# Patient Record
Sex: Female | Born: 1975 | Race: White | Hispanic: No | Marital: Single | Smoking: Never smoker
Health system: Southern US, Community
[De-identification: ages and names within clinical notes are randomized; demographics above are authoritative.]

## PROBLEM LIST (undated history)

## (undated) ENCOUNTER — Inpatient Hospital Stay (HOSPITAL_COMMUNITY): Payer: Self-pay

## (undated) DIAGNOSIS — B999 Unspecified infectious disease: Secondary | ICD-10-CM

## (undated) DIAGNOSIS — F32A Depression, unspecified: Secondary | ICD-10-CM

## (undated) DIAGNOSIS — D6851 Activated protein C resistance: Secondary | ICD-10-CM

## (undated) DIAGNOSIS — N200 Calculus of kidney: Secondary | ICD-10-CM

## (undated) DIAGNOSIS — R87619 Unspecified abnormal cytological findings in specimens from cervix uteri: Secondary | ICD-10-CM

## (undated) DIAGNOSIS — IMO0002 Reserved for concepts with insufficient information to code with codable children: Secondary | ICD-10-CM

## (undated) DIAGNOSIS — F329 Major depressive disorder, single episode, unspecified: Secondary | ICD-10-CM

## (undated) DIAGNOSIS — E039 Hypothyroidism, unspecified: Secondary | ICD-10-CM

## (undated) HISTORY — PX: NO PAST SURGERIES: SHX2092

## (undated) HISTORY — PX: CERVICAL CERCLAGE: SHX1329

## (undated) HISTORY — PX: CRYOTHERAPY: SHX1416

---

## 2000-09-20 ENCOUNTER — Emergency Department (HOSPITAL_COMMUNITY): Admission: EM | Admit: 2000-09-20 | Discharge: 2000-09-20 | Payer: Self-pay | Admitting: Emergency Medicine

## 2000-10-23 ENCOUNTER — Encounter: Admission: RE | Admit: 2000-10-23 | Discharge: 2000-10-23 | Payer: Self-pay | Admitting: Hematology and Oncology

## 2001-06-11 ENCOUNTER — Emergency Department (HOSPITAL_COMMUNITY): Admission: EM | Admit: 2001-06-11 | Discharge: 2001-06-12 | Payer: Self-pay | Admitting: Emergency Medicine

## 2001-12-31 ENCOUNTER — Inpatient Hospital Stay (HOSPITAL_COMMUNITY): Admission: EM | Admit: 2001-12-31 | Discharge: 2002-01-03 | Payer: Self-pay | Admitting: Psychiatry

## 2002-10-06 ENCOUNTER — Other Ambulatory Visit: Admission: RE | Admit: 2002-10-06 | Discharge: 2002-10-06 | Payer: Self-pay | Admitting: *Deleted

## 2002-10-06 ENCOUNTER — Encounter: Admission: RE | Admit: 2002-10-06 | Discharge: 2002-10-06 | Payer: Self-pay | Admitting: *Deleted

## 2003-01-21 ENCOUNTER — Emergency Department (HOSPITAL_COMMUNITY): Admission: EM | Admit: 2003-01-21 | Discharge: 2003-01-21 | Payer: Self-pay | Admitting: Emergency Medicine

## 2003-01-21 ENCOUNTER — Encounter: Payer: Self-pay | Admitting: Emergency Medicine

## 2003-09-19 ENCOUNTER — Emergency Department (HOSPITAL_COMMUNITY): Admission: EM | Admit: 2003-09-19 | Discharge: 2003-09-19 | Payer: Self-pay | Admitting: Emergency Medicine

## 2003-09-21 ENCOUNTER — Inpatient Hospital Stay (HOSPITAL_COMMUNITY): Admission: AD | Admit: 2003-09-21 | Discharge: 2003-09-21 | Payer: Self-pay | Admitting: *Deleted

## 2003-09-22 ENCOUNTER — Inpatient Hospital Stay (HOSPITAL_COMMUNITY): Admission: AD | Admit: 2003-09-22 | Discharge: 2003-09-22 | Payer: Self-pay | Admitting: Obstetrics and Gynecology

## 2003-09-23 ENCOUNTER — Inpatient Hospital Stay (HOSPITAL_COMMUNITY): Admission: AD | Admit: 2003-09-23 | Discharge: 2003-09-23 | Payer: Self-pay | Admitting: Obstetrics and Gynecology

## 2003-10-19 ENCOUNTER — Encounter: Admission: RE | Admit: 2003-10-19 | Discharge: 2003-10-19 | Payer: Self-pay | Admitting: Obstetrics and Gynecology

## 2003-11-09 ENCOUNTER — Encounter: Admission: RE | Admit: 2003-11-09 | Discharge: 2003-11-09 | Payer: Self-pay | Admitting: Obstetrics and Gynecology

## 2004-02-12 ENCOUNTER — Inpatient Hospital Stay (HOSPITAL_COMMUNITY): Admission: AD | Admit: 2004-02-12 | Discharge: 2004-02-12 | Payer: Self-pay | Admitting: Obstetrics & Gynecology

## 2004-02-17 ENCOUNTER — Inpatient Hospital Stay (HOSPITAL_COMMUNITY): Admission: RE | Admit: 2004-02-17 | Discharge: 2004-02-17 | Payer: Self-pay | Admitting: Obstetrics & Gynecology

## 2004-03-02 ENCOUNTER — Ambulatory Visit (HOSPITAL_COMMUNITY): Admission: RE | Admit: 2004-03-02 | Discharge: 2004-03-02 | Payer: Self-pay | Admitting: *Deleted

## 2004-03-09 ENCOUNTER — Encounter: Admission: RE | Admit: 2004-03-09 | Discharge: 2004-03-09 | Payer: Self-pay | Admitting: Obstetrics and Gynecology

## 2004-03-13 ENCOUNTER — Ambulatory Visit (HOSPITAL_COMMUNITY): Admission: RE | Admit: 2004-03-13 | Discharge: 2004-03-13 | Payer: Self-pay | Admitting: Obstetrics & Gynecology

## 2004-04-11 ENCOUNTER — Inpatient Hospital Stay (HOSPITAL_COMMUNITY): Admission: AD | Admit: 2004-04-11 | Discharge: 2004-04-11 | Payer: Self-pay | Admitting: *Deleted

## 2004-05-19 ENCOUNTER — Ambulatory Visit (HOSPITAL_COMMUNITY): Admission: RE | Admit: 2004-05-19 | Discharge: 2004-05-19 | Payer: Self-pay | Admitting: Obstetrics & Gynecology

## 2004-05-26 ENCOUNTER — Inpatient Hospital Stay (HOSPITAL_COMMUNITY): Admission: AD | Admit: 2004-05-26 | Discharge: 2004-05-26 | Payer: Self-pay | Admitting: Obstetrics

## 2004-07-25 ENCOUNTER — Ambulatory Visit (HOSPITAL_COMMUNITY): Admission: RE | Admit: 2004-07-25 | Discharge: 2004-07-25 | Payer: Self-pay | Admitting: Obstetrics & Gynecology

## 2004-09-12 ENCOUNTER — Inpatient Hospital Stay (HOSPITAL_COMMUNITY): Admission: AD | Admit: 2004-09-12 | Discharge: 2004-09-19 | Payer: Self-pay | Admitting: Obstetrics & Gynecology

## 2004-09-17 ENCOUNTER — Encounter (INDEPENDENT_AMBULATORY_CARE_PROVIDER_SITE_OTHER): Payer: Self-pay | Admitting: *Deleted

## 2004-09-20 ENCOUNTER — Encounter: Admission: RE | Admit: 2004-09-20 | Discharge: 2004-09-20 | Payer: Self-pay | Admitting: Obstetrics & Gynecology

## 2005-03-13 ENCOUNTER — Inpatient Hospital Stay (HOSPITAL_COMMUNITY): Admission: AD | Admit: 2005-03-13 | Discharge: 2005-03-13 | Payer: Self-pay | Admitting: *Deleted

## 2006-04-03 ENCOUNTER — Encounter: Admission: RE | Admit: 2006-04-03 | Discharge: 2006-04-03 | Payer: Self-pay

## 2008-05-07 ENCOUNTER — Emergency Department (HOSPITAL_COMMUNITY): Admission: EM | Admit: 2008-05-07 | Discharge: 2008-05-07 | Payer: Self-pay | Admitting: Family Medicine

## 2008-07-17 ENCOUNTER — Emergency Department (HOSPITAL_COMMUNITY): Admission: EM | Admit: 2008-07-17 | Discharge: 2008-07-17 | Payer: Self-pay | Admitting: Family Medicine

## 2009-01-28 ENCOUNTER — Inpatient Hospital Stay (HOSPITAL_COMMUNITY): Admission: AD | Admit: 2009-01-28 | Discharge: 2009-01-28 | Payer: Self-pay | Admitting: Obstetrics & Gynecology

## 2009-02-05 ENCOUNTER — Inpatient Hospital Stay (HOSPITAL_COMMUNITY): Admission: AD | Admit: 2009-02-05 | Discharge: 2009-02-05 | Payer: Self-pay | Admitting: Obstetrics & Gynecology

## 2009-08-04 ENCOUNTER — Encounter: Admission: RE | Admit: 2009-08-04 | Discharge: 2009-08-04 | Payer: Self-pay | Admitting: Obstetrics and Gynecology

## 2009-10-04 ENCOUNTER — Inpatient Hospital Stay (HOSPITAL_COMMUNITY): Admission: AD | Admit: 2009-10-04 | Discharge: 2009-10-06 | Payer: Self-pay | Admitting: Obstetrics and Gynecology

## 2009-10-12 ENCOUNTER — Ambulatory Visit: Admission: RE | Admit: 2009-10-12 | Discharge: 2009-10-12 | Payer: Self-pay | Admitting: Obstetrics and Gynecology

## 2010-10-15 ENCOUNTER — Encounter: Payer: Self-pay | Admitting: Obstetrics and Gynecology

## 2010-12-10 LAB — CBC
HCT: 36.5 % (ref 36.0–46.0)
HCT: 41.7 % (ref 36.0–46.0)
Hemoglobin: 12.3 g/dL (ref 12.0–15.0)
Hemoglobin: 13.9 g/dL (ref 12.0–15.0)
MCHC: 33.3 g/dL (ref 30.0–36.0)
MCHC: 33.6 g/dL (ref 30.0–36.0)
MCV: 94.2 fL (ref 78.0–100.0)
MCV: 95.3 fL (ref 78.0–100.0)
Platelets: 250 10*3/uL (ref 150–400)
Platelets: 326 10*3/uL (ref 150–400)
RBC: 3.83 MIL/uL — ABNORMAL LOW (ref 3.87–5.11)
RBC: 4.43 MIL/uL (ref 3.87–5.11)
RDW: 13.5 % (ref 11.5–15.5)
RDW: 13.6 % (ref 11.5–15.5)
WBC: 11.8 10*3/uL — ABNORMAL HIGH (ref 4.0–10.5)
WBC: 13.8 10*3/uL — ABNORMAL HIGH (ref 4.0–10.5)

## 2010-12-10 LAB — RPR: RPR Ser Ql: NONREACTIVE

## 2011-01-02 LAB — WET PREP, GENITAL
Clue Cells Wet Prep HPF POC: NONE SEEN
Trich, Wet Prep: NONE SEEN
Yeast Wet Prep HPF POC: NONE SEEN

## 2011-01-02 LAB — CBC
Platelets: 333 10*3/uL (ref 150–400)
RBC: 4.14 MIL/uL (ref 3.87–5.11)
WBC: 8.9 10*3/uL (ref 4.0–10.5)

## 2011-01-02 LAB — HCG, QUANTITATIVE, PREGNANCY
hCG, Beta Chain, Quant, S: 26306 m[IU]/mL — ABNORMAL HIGH (ref <5)
hCG, Beta Chain, Quant, S: 2721 m[IU]/mL — ABNORMAL HIGH (ref <5)

## 2011-01-02 LAB — GC/CHLAMYDIA PROBE AMP, GENITAL
Chlamydia, DNA Probe: NEGATIVE
GC Probe Amp, Genital: NEGATIVE

## 2011-01-02 LAB — ABO/RH: ABO/RH(D): A POS

## 2011-02-09 NOTE — Discharge Summary (Signed)
NAME:  Barbara Tyler, COSTABILE NO.:  [id number]   MEDICAL RECORD NO.:  [id number]          PATIENT TYPE:  INP   LOCATION:  9317                          [facility]ILITY:  [facility]   PHYSICIAN:  Roseanna Rainbow, M.D.DATE OF BIRTH:  1975-12-14   DATE OF ADMISSION:  09/12/2004  DATE OF DISCHARGE:  09/19/2004                                 DISCHARGE SUMMARY   CHIEF COMPLAINT:  The patient is a 35 year old para 0 with an estimated date  of confinement of October 26, 2004, with an intrauterine pregnancy of 33-5/7  weeks, complaining of right-sided flank pain and nausea and vomiting.  Please see the dictated history and physical for further details.   HOSPITAL COURSE:  The patient was admitted.  She was started on parenteral  antibiotics and received IV hydration and narcotic analgesics.  Her symptoms  improved.  An ultrasound demonstrated moderate-to-severe right-sided  hydronephrosis with possible ureteral obstruction.  Urology was consulted  and the patient elected to continue with supportive management.  She then  had spontaneous rupture of membranes.  She subsequently went into preterm  labor and she had a spontaneous vaginal delivery on September 17, 2004.  She  was delivered of a live born female.  Apgars were 9 at one and five minutes  respectively.  The weight was 6 pounds, length was 20 inches.  Post partum  she did well and was discharged to home on post partum day #2.   DISCHARGE DIAGNOSES:  1.  Intrauterine pregnancy at 33+ weeks.  2.  Right-sided urolithiasis.  3.  Preterm premature rupture of membranes with preterm labor and a      subsequent preterm delivery.   CONDITION:  Good.   DIET:  Regular.   ACTIVITY:  Pelvic rest.   MEDICATIONS:  1.  Percocet.  2.  Phenergan.  3.  Prenatal vitamins.   DISPOSITION:  The patient was to follow up in two weeks in the office and  she was also counseled to follow up with urology.      LAJ/MEDQ  D:  10/18/2004  T:   10/18/2004  Job:  [id number]

## 2011-02-09 NOTE — Group Therapy Note (Signed)
NAME:  Barbara Tyler, Barbara Tyler NO.:  [id number]   MEDICAL RECORD NO.:  [id number]                   PATIENT TYPE:  OUT   LOCATION:  [facility]                           [facility]AC[facility]   PHYSICIAN:  Argentina Donovan, MD                     DATE OF BIRTH:  1976-05-03   DATE OF SERVICE:  10/19/2003                                    CLINIC NOTE   REASON FOR VISIT:  The patient is a 35 year old gravida 1 para 0-0-1-0 who  miscarried about 4 weeks ago.  She had had a colposcopy in January 2004  which showed nothing except a slight dysplasia of the cervix, CIN 1, and she  was told to follow up in 6 months.  She was never contacted and finally got  her repeat Pap in November which was abnormal at [facility].  Unfortunately, we do not have the Pap smear result in this young lady who is  a smoker at this point.  We are going to request that it be sent over.  I  think we will do a colposcopy and probably a cryosurgery in one visit, so a  look and treat at the same time unless she has a very high-graded Pap smear.  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   IMPRESSION:  Atypical Pap smear, not available at this time - awaiting  results.                                               Argentina Donovan, MD    PR/MEDQ  D:  10/19/2003  T:  10/19/2003  Job:  [id number]

## 2011-02-09 NOTE — Discharge Summary (Signed)
[facility]  Patient:    ALAJA, GOLDINGER Visit Number: [id number] MRN: [id number]          Service Type: PSY Location: 500 0500 01 Attending Physician:  Jeanice Lim Dictated by:   Jeanice Lim, M.D. Admit Date:  12/31/2001 Discharge Date: 01/03/2002                             Discharge Summary  IDENTIFYING DATA:  This is a 35 year old single Caucasian female voluntarily admitted with suicidal thoughts.  MEDICATIONS:  None.  ALLERGIES:  No known drug allergies.  PHYSICAL EXAMINATION:  Within normal limits.  Neurologically nonfocal.  LABORATORY DATA:  Routine admission labs with CBC and CMET within normal limits.  Urine drug screen positive for marijuana.  Urine pregnancy test negative.  MENTAL STATUS EXAMINATION:  Alert, healthy-appearing female with good hygiene. Pleasant and cooperative.  Good eye contact.  Mood mostly stable and euthymic. Affect bright.  Thought process goal directed.  Thought content predominated by concerns revolving around staying on her medication when not having financial resources.  Cognitively intact.  Judgment and insight fair.  ADMISSION DIAGNOSES: Axis I:    Major depression, recurrent, severe. Axis II:   None. Axis III:  Irritable bowel syndrome by history. Axis IV:   Moderate (problems with primary support system). Axis V:    34/70.  HOSPITAL COURSE:  The patient was admitted with routine p.r.n. medications. Restarted on Lexapro and Ambien p.r.n.  The patient had nausea with Lexapro and was switched to Celexa.  The patient tolerated Celexa without side effects and reported a positive response to clinical intervention.  CONDITION ON DISCHARGE:  Improved mood.  Affect brighter.  Thought process goal directed.  Thought content negative for dangerous ideation or psychotic symptoms.  The patient reported motivation to be compliant with follow-up plan.  DISCHARGE MEDICATIONS: 1. Celexa 20 mg, 1-1/2  q.a.m. 2. Ambien 10 mg q.h.s. p.r.n. insomnia.  FOLLOW-UP:  [facility] on Monday, January 05, 2002 at 11:30 a.m.  DISCHARGE DIAGNOSES: Axis I:    Major depression, recurrent, severe. Axis II:   None. Axis III:  Irritable bowel syndrome by history. Axis IV:   Moderate (problems with primary support system). Axis V:    Global Assessment of Functioning on discharge 55. Dictated by:   Jeanice Lim, M.D. Attending Physician:  Jeanice Lim DD:  02/04/02 TD:  02/06/02 Job: [id number]

## 2011-02-09 NOTE — H&P (Signed)
NAME:  Barbara Tyler, Barbara Tyler NO.:  [id number]   MEDICAL RECORD NO.:  [id number]          PATIENT TYPE:  INP   LOCATION:  9111                          [facility]   PHYSICIAN:  Roseanna Rainbow, M.D.DATE OF BIRTH:  Apr 30, 1976   DATE OF ADMISSION:  09/12/2004  DATE OF DISCHARGE:                                HISTORY & PHYSICAL   CHIEF COMPLAINT:  The patient is a 35 year old para 0 with an estimated date  of confinement of October 26, 2004, with an intrauterine pregnancy at 33-5/7  weeks, complaining of right-sided flank pain and nausea and vomiting.   HISTORY OF PRESENT ILLNESS:  See above.  She denies any fever or urinary  tract infection symptoms.   ANTEPARTUM COURSE:  Prenatal care with [facility]; onset of care at 7 weeks.  Antepartum problems / risks: history of THC use.  The patient was treated  for a urinary tract infection in the first trimester of pregnancy.   PRENATAL SCREENS:  Hemoglobin 13.6, platelets 320,000.  Blood type A-  positive, Rh-antibody negative.  RPR nonreactive.  Rubella immune.  Hepatitis B surface antigen negative.  HIV nonreactive.  Urine culture and  sensitivity:  No growth.   PAST OBSTETRICAL/GYNECOLOGICAL HISTORY:  She has a history of 2 first  trimester spontaneous abortions.  She has a history of cervical dysplasia;  she is status post cryocautery in February of 2005.   PAST MEDICAL HISTORY:  Past medical history remarkable for irritable bowel  syndrome.   PAST SURGICAL HISTORY:  She denies.   FAMILY HISTORY:  Bipolar disorder.   SOCIAL HISTORY:  She has a history of tobacco use, history of THC use.  She  denies any alcohol abuse.   ALLERGIES:  No known drug allergies.   MEDICATIONS:  Prenatal vitamins.   PHYSICAL EXAM:  VITAL SIGNS:  Stable, afebrile.  GENERAL:  The patient appears uncomfortable.  ABDOMEN:  Fetal heart tracing reassuring.  Rare uterine contractions.  Abdomen gravid.  BACK:  Right-sided CVA  tenderness.  PELVIC:  Pelvic exam as per R.N., the cervix is long, closed and posterior.   OTHER LABORATORY AND WORKUP:  White blood cell count 15.7.  Complete  metabolic profile normal.  Urinalysis with moderate hemoglobin, trace  leukocyte esterase, 30 of protein; microscopy with 3-6 rbc's per high-power  field, many bacteria, wbc's 0-2 per high-power field.   A renal ultrasound demonstrate moderate-to-severe right-sided  hydronephrosis, bilateral ureteral jets seen, partial right ureteral  obstruction may be present.   ASSESSMENT:  1.  Intrauterine pregnancy at 33+ weeks.  2.  Right-sided urolithiasis.   PLAN:  Admission, supportive therapy including pain control and IV  hydration, parenteral antibiotics.     Collier Flowers  D:  09/12/2004  T:  09/12/2004  Job:  [id number]

## 2011-02-09 NOTE — H&P (Signed)
[facility]  Patient:    Barbara Tyler, Barbara Tyler Visit Number: [id number] MRN: [id number]          Service Type: PSY Location: 500 0500 01 Attending Physician:  Jeanice Lim Dictated by:   Young Berry Scott, N.P. Admit Date:  12/31/2001                     Psychiatric Admission Assessment  DATE OF ASSESSMENT:  January 01, 2002 at 9:40 a.m.  IDENTIFYING INFORMATION:  This is a 35 year old single Caucasian female who is a voluntary admission.  HISTORY OF PRESENT ILLNESS:  This patient had suicidal thoughts yesterday and attempted to cut her left wrist.  She reports a gradual increase in depression for the past 1-1/2 years after the death of her mother and grandmother along with recent worries about her sister, who is age 44.  The patient endorses progressively increasing anhedonia, sadness with frequent episodes of spontaneous crying, initial insomnia, sleeping only 2-3 hours per night and 1-2 times weekly episodes of anxiety with some chest tightening and shortness of breath.  The patient reports that she did well on Celexa for about six months but then this was stopped by mental health approximately a year ago. Since that time, she has felt her depression increase with her symptoms worse in the past month.  She is able to contract for safety on the unit.  PAST PSYCHIATRIC HISTORY:  The patient is followed at [facility] for the past 1-2 years.  She does have a past history of being treated in residential treatment program for depression at ages 50 and 40.  She denies any prior history of suicide attempts.  SOCIAL HISTORY:  The patient is from [place].  She has been in [place] for the past five years, living in [place] with her fiance.  She attends the community college sporadically.  She has a high school education. No children.  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  She has no history of legal  problems.  FAMILY HISTORY:  Sister with anxiety.  ALCOHOL/DRUG HISTORY:  The patient uses alcohol occasionally, 2-3 times monthly.  She smokes marijuana approximately 3-4 times a month and smokes one pack per day of cigarettes.  MEDICAL HISTORY:  The patient is followed at [facility] for frequent episodes of bacterial vaginosis.  She is not currently on medications.  She does have superficial lacerations through the dermis only to her left wrist and these are crusted and appear to be healing.  The patient also reports a past history of irritable bowel syndrome.  Surgeries are none. Hospitalizations are none.  MEDICATIONS:  None.  The patient uses barrier contraception.  DRUG ALLERGIES:  None.  POSITIVE PHYSICAL FINDINGS:  The patients full physical examination is currently pending.  Her vital signs, on admission to the unit, were temperature 97.7, pulse 90, respirations 20, blood pressure 106/65.  LABORATORY DATA:  Her CBC and CMET are within normal limits.  Her urine drug screen is positive for marijuana.  Her urine pregnancy test was negative.  Her thyroid panel is currently pending.  MENTAL STATUS EXAMINATION:  This is an alert, healthy-appearing female with good hygiene.  She is very pleasant and cooperative with a bright affect, in no acute distress.  She has good eye contact.  She is able to smile brightly though she is appropriately sad when discussing the loss of her mother and grandmother.  Speech is normal in pace and tone  and is spontaneous.  Mood is depressed.  Thought process is logical, goal directed and she is able to track conversations well.  Thought content is predominated by concerns about resolving her symptoms and how best to stay on her medications since she does not have financial resources.  She is appropriately concerned about her sister and is interested in grief counseling for herself.  Cognitively, she is intact and oriented x 3.  Intelligence  is above average.  Judgment and impulse control within normal limits.  Insight is very good.  DIAGNOSES: Axis I:    Major depression, recurrent, severe. Axis II:   None. Axis III:  Irritable bowel syndrome by history. Axis IV:   Moderate (psychosocial problems related to grief over the loss of            her mother and grandmother). Axis V:    Current 36; past year 71.  PLAN:  Voluntarily admit the patient to stabilize her mood and to alleviate her suicidal ideation and strengthen her coping skills.  We have elected to start her on Lexapro 5 mg daily since she does have a history of sensitive stomach and irritable bowel syndrome.  We will start her on a low dose and see how she reacts and increase her dosage appropriately from there.  We will titrate upwards from there.  We have also started her on Ambien 10 mg p.r.n. for insomnia at bedtime and we have discussed the risks and benefits of both these medications and the potential side effects and she is agreeable to starting these.  We will request the casemanager to remember and get her samples for these medications prior to her discharge, so we can make sure that we can assist her in medication compliance and we have discussed other services available at [facility] for ongoing grief counseling.  She will pursue that.  ESTIMATED LENGTH OF STAY:  Three to four days. Dictated by:   Young Berry Scott, N.P. Attending Physician:  Jeanice Lim DD:  01/01/02 TD:  01/01/02 Job: [id number]

## 2011-02-09 NOTE — Group Therapy Note (Signed)
NAME:  Barbara Tyler, Barbara Tyler NO.:  [id number]   MEDICAL RECORD NO.:  [id number]                   PATIENT TYPE:  OUT   LOCATION:  [facility]                           [facility]ACILITY[facility]  [facility]   PHYSICIAN:  Argentina Donovan, MD                     DATE OF BIRTH:  [birth year]   DATE OF SERVICE:  11/09/2003                                    CLINIC NOTE   REASON FOR VISIT:  The patient is a 35 year old gravida 2 para 0-0-2-0 who  had a colposcopy on October 06, 2002 and found to have slight dysplasia.  Pap was repeated at [facility] and she had SIL at the same time.  She has come back for another colposcopy at which time was a successful  colposcopy showing significant area of acetowhite changes between 9 and 1  o'clock.  The endocervix could be easily visualized and the entire 360  degrees of transition zone.  There was no sign of any atypical vessels,  atypia, __________ vessels, or punctations or mosaicism, and my feeling is  this is certainly no worse than the patient had before, so we are going to  immediately treat her with cryosurgery 3 minutes, 5 minute thaw, and a 3  minute freeze.   DIAGNOSIS:  Cervical dysplasia, CIN 1, slight; treated with cryosurgery.  Will repeat the Pap smear in 4 months.                                               Argentina Donovan, MD    PR/MEDQ  D:  11/09/2003  T:  11/09/2003  Job:  [id number]

## 2011-06-22 LAB — POCT RAPID STREP A: Streptococcus, Group A Screen (Direct): POSITIVE — AB

## 2011-06-25 LAB — URINE CULTURE
Colony Count: NO GROWTH
Culture: NO GROWTH

## 2011-06-25 LAB — POCT URINALYSIS DIP (DEVICE)
Nitrite: NEGATIVE
Protein, ur: 30 — AB
pH: 6

## 2011-06-25 LAB — POCT PREGNANCY, URINE: Preg Test, Ur: NEGATIVE

## 2013-02-05 ENCOUNTER — Inpatient Hospital Stay (HOSPITAL_COMMUNITY): Payer: Medicaid Other

## 2013-02-05 ENCOUNTER — Inpatient Hospital Stay (HOSPITAL_COMMUNITY)
Admission: AD | Admit: 2013-02-05 | Discharge: 2013-02-05 | Disposition: A | Discharge status: Home / Self Care | Payer: Medicaid Other | Source: Ambulatory Visit | Attending: Obstetrics and Gynecology | Admitting: Obstetrics and Gynecology

## 2013-02-05 ENCOUNTER — Encounter (HOSPITAL_COMMUNITY): Payer: Self-pay | Admitting: *Deleted

## 2013-02-05 DIAGNOSIS — O368390 Maternal care for abnormalities of the fetal heart rate or rhythm, unspecified trimester, not applicable or unspecified: Secondary | ICD-10-CM | POA: Insufficient documentation

## 2013-02-05 DIAGNOSIS — R109 Unspecified abdominal pain: Secondary | ICD-10-CM | POA: Insufficient documentation

## 2013-02-05 DIAGNOSIS — O209 Hemorrhage in early pregnancy, unspecified: Secondary | ICD-10-CM

## 2013-02-05 DIAGNOSIS — O0990 Supervision of high risk pregnancy, unspecified, unspecified trimester: Secondary | ICD-10-CM

## 2013-02-05 DIAGNOSIS — O26859 Spotting complicating pregnancy, unspecified trimester: Secondary | ICD-10-CM | POA: Insufficient documentation

## 2013-02-05 LAB — URINALYSIS, ROUTINE W REFLEX MICROSCOPIC
Glucose, UA: NEGATIVE mg/dL
Leukocytes, UA: NEGATIVE
Protein, ur: NEGATIVE mg/dL
Specific Gravity, Urine: 1.025 (ref 1.005–1.030)
Urobilinogen, UA: 0.2 mg/dL (ref 0.0–1.0)

## 2013-02-05 LAB — ABO/RH: ABO/RH(D): A POS

## 2013-02-05 LAB — CBC
HCT: 36.6 % (ref 36.0–46.0)
Hemoglobin: 12.5 g/dL (ref 12.0–15.0)
MCHC: 34.2 g/dL (ref 30.0–36.0)
RDW: 12 % (ref 11.5–15.5)
WBC: 7.3 10*3/uL (ref 4.0–10.5)

## 2013-02-05 LAB — POCT PREGNANCY, URINE: Preg Test, Ur: POSITIVE — AB

## 2013-02-05 LAB — HCG, QUANTITATIVE, PREGNANCY: hCG, Beta Chain, Quant, S: 19342 m[IU]/mL — ABNORMAL HIGH (ref <5)

## 2013-02-05 LAB — URINE MICROSCOPIC-ADD ON

## 2013-02-05 NOTE — MAU Note (Signed)
Pt states was out shopping when she had gush of blood. Was not having any pain however is feeling crampy now.

## 2013-02-05 NOTE — MAU Provider Note (Signed)
Chief Complaint  Patient presents with  . Vaginal Bleeding    Subjective Barbara Tyler 37 y.o.  [id number] at [redacted]w[redacted]d by LMP presents with onset today of first episode of a gush of bright red vaginal blood. About 2 wks ago she had a spot of brown blood which she thought was implantation spotting. Has menstrual-like crampy lower abdominal pain which began shortly after the bleeding. Last intercourse this morning a few hours prior to onset of bleeding.  Denies abnormal vaginal discharge or irritation. No dysuria or hematuria. Declines STI testing. Blood type: A pos  Problem list, past medical history, Ob/Gyn history, surgical history, family history and social history reviewed and updated as appropriate. Pertinent Medical History: N/C Pertinent Ob/Gyn History: PTB @32  wks, early SAB x3 (last was Feb 2014) Pertinent Surgical History: none Prescriptions prior to admission  Medication Sig Dispense Refill  . acetaminophen (TYLENOL) 500 MG tablet Take 500 mg by mouth daily as needed for pain.      . Prenatal Vit-Fe Fumarate-FA (PRENATAL MULTIVITAMIN) TABS Take 1 tablet by mouth at bedtime.         Allergies  Allergen Reactions  . Latex Itching and Rash    Burning of skin     Objective   Filed Vitals:   02/05/13 1243  BP: 100/68  Pulse: 77  Temp: 97.5 F (36.4 C)  Resp: 16     Physical Exam General: WN/WD in NAD  Abdom: soft, NT External genitalia: normal; BUS neg  SSE: moderate amount blood; cervix with no lesions, appears closed Bimanual: Cervix closed, long; uterus anteverted, NT, 6-55m weeks size; adnexa nontender, no masses   Lab Results Results for orders placed during the hospital encounter of 02/05/13 (from the past 24 hour(s))  URINALYSIS, ROUTINE W REFLEX MICROSCOPIC     Status: Abnormal   Collection Time    02/05/13 12:38 PM      Result Value Range   Color, Urine YELLOW  YELLOW   APPearance CLEAR  CLEAR   Specific Gravity, Urine 1.025  1.005 - 1.030   pH 5.5   5.0 - 8.0   Glucose, UA NEGATIVE  NEGATIVE mg/dL   Hgb urine dipstick SMALL (*) NEGATIVE   Bilirubin Urine NEGATIVE  NEGATIVE   Ketones, ur NEGATIVE  NEGATIVE mg/dL   Protein, ur NEGATIVE  NEGATIVE mg/dL   Urobilinogen, UA 0.2  0.0 - 1.0 mg/dL   Nitrite NEGATIVE  NEGATIVE   Leukocytes, UA NEGATIVE  NEGATIVE  URINE MICROSCOPIC-ADD ON     Status: None   Collection Time    02/05/13 12:38 PM      Result Value Range   WBC, UA 0-2  <3 WBC/hpf   RBC / HPF 0-2  <3 RBC/hpf   Bacteria, UA RARE  RARE  POCT PREGNANCY, URINE     Status: Abnormal   Collection Time    02/05/13 12:40 PM      Result Value Range   Preg Test, Ur POSITIVE (*) NEGATIVE  CBC     Status: None   Collection Time    02/05/13  1:00 PM      Result Value Range   WBC 7.3  4.0 - 10.5 K/uL   RBC 4.01  3.87 - 5.11 MIL/uL   Hemoglobin 12.5  12.0 - 15.0 g/dL   HCT 40.9  81.1 - 91.4 %   MCV 91.3  78.0 - 100.0 fL   MCH 31.2  26.0 - 34.0 pg   MCHC 34.2  30.0 -  36.0 g/dL   RDW 16.1  09.6 - 04.5 %   Platelets 306  150 - 400 K/uL  ABO/RH     Status: None   Collection Time    02/05/13  1:00 PM      Result Value Range   ABO/RH(D) A POS    HCG, QUANTITATIVE, PREGNANCY     Status: Abnormal   Collection Time    02/05/13  1:00 PM      Result Value Range   hCG, Beta Chain, Quant, S 19342 (*) <5 mIU/mL    Ultrasound    Assessment 1. Bleeding in early pregnancy   2. At high risk for complications of intrauterine pregnancy (IUP)   [redacted]w[redacted]d IUP, embryo with bradycardia Hx PTB and SAB x3  Plan     Discharge home See AVS for pt education    Medication List    TAKE these medications       acetaminophen 500 MG tablet  Commonly known as:  TYLENOL  Take 500 mg by mouth daily as needed for pain.     prenatal multivitamin Tabs  Take 1 tablet by mouth at bedtime.         Follow-up Information   Follow up with [facility] In 2 weeks. (Someone form [facility] will call you with an appontment for F/U  US and prenatal appointment)        Barbara Tyler 02/05/2013 12:50 PM

## 2013-02-06 NOTE — MAU Provider Note (Signed)
Attestation of Attending Supervision of Advanced Practitioner (CNM/NP): Evaluation and management procedures were performed by the Advanced Practitioner under my supervision and collaboration.  I have reviewed the Advanced Practitioner's note and chart, and I agree with the management and plan.  Fredric Slabach 02/06/2013 7:14 AM

## 2013-02-09 ENCOUNTER — Telehealth: Payer: Self-pay | Admitting: *Deleted

## 2013-02-09 NOTE — Telephone Encounter (Signed)
Patient left a message stating that she was seen for bleeding in pregnancy last week 5/15. She was told that we would contact her to have a repeat ultrasound in 1 week. She has not yet heard from [place]. She is scheduled for her first ob visit on 03/03/13.

## 2013-02-10 ENCOUNTER — Telehealth: Payer: Self-pay | Admitting: *Deleted

## 2013-02-10 DIAGNOSIS — O2621 Pregnancy care for patient with recurrent pregnancy loss, first trimester: Secondary | ICD-10-CM

## 2013-02-10 NOTE — Telephone Encounter (Addendum)
Pt left message stating that she is waiting for a call regarding [place] appt.  (pt has new Ob clinic appt on 6/10 and per notes from Deirdre, pt needs repeat trans vag [place] prior to appt)   I scheduled the [place] appt for 5/23 @ 0845. I called pt and left message to call us back and indicate whether or not it is ok to leave appt details on her voice mail.

## 2013-02-13 ENCOUNTER — Encounter (HOSPITAL_COMMUNITY): Payer: Self-pay

## 2013-02-13 ENCOUNTER — Inpatient Hospital Stay (HOSPITAL_COMMUNITY)
Admission: AD | Admit: 2013-02-13 | Discharge: 2013-02-13 | Disposition: A | Discharge status: Home / Self Care | Payer: Medicaid Other | Source: Ambulatory Visit | Attending: Obstetrics and Gynecology | Admitting: Obstetrics and Gynecology

## 2013-02-13 ENCOUNTER — Ambulatory Visit (HOSPITAL_COMMUNITY)
Admission: RE | Admit: 2013-02-13 | Discharge: 2013-02-13 | Disposition: A | Discharge status: Home / Self Care | Payer: Medicaid Other | Source: Ambulatory Visit | Attending: Obstetrics and Gynecology | Admitting: Obstetrics and Gynecology

## 2013-02-13 DIAGNOSIS — Z8751 Personal history of pre-term labor: Secondary | ICD-10-CM | POA: Insufficient documentation

## 2013-02-13 DIAGNOSIS — O2621 Pregnancy care for patient with recurrent pregnancy loss, first trimester: Secondary | ICD-10-CM

## 2013-02-13 DIAGNOSIS — O021 Missed abortion: Secondary | ICD-10-CM | POA: Insufficient documentation

## 2013-02-13 DIAGNOSIS — O039 Complete or unspecified spontaneous abortion without complication: Secondary | ICD-10-CM

## 2013-02-13 DIAGNOSIS — O2620 Pregnancy care for patient with recurrent pregnancy loss, unspecified trimester: Secondary | ICD-10-CM | POA: Insufficient documentation

## 2013-02-13 DIAGNOSIS — O3680X0 Pregnancy with inconclusive fetal viability, not applicable or unspecified: Secondary | ICD-10-CM | POA: Insufficient documentation

## 2013-02-13 DIAGNOSIS — O209 Hemorrhage in early pregnancy, unspecified: Secondary | ICD-10-CM | POA: Insufficient documentation

## 2013-02-13 DIAGNOSIS — R109 Unspecified abdominal pain: Secondary | ICD-10-CM | POA: Insufficient documentation

## 2013-02-13 LAB — CBC
HCT: 37.4 % (ref 36.0–46.0)
MCHC: 34.2 g/dL (ref 30.0–36.0)
MCV: 90.8 fL (ref 78.0–100.0)
Platelets: 297 10*3/uL (ref 150–400)
RDW: 11.9 % (ref 11.5–15.5)
WBC: 7 10*3/uL (ref 4.0–10.5)

## 2013-02-13 MED ORDER — MISOPROSTOL 200 MCG PO TABS: ORAL_TABLET | ORAL | Status: DC

## 2013-02-13 MED ORDER — IBUPROFEN 600 MG PO TABS: 600.0000 mg | ORAL_TABLET | Freq: Four times a day (QID) | ORAL | Status: DC | PRN

## 2013-02-13 MED ORDER — OXYCODONE-ACETAMINOPHEN 5-325 MG PO TABS: 1.0000 | ORAL_TABLET | ORAL | Status: DC | PRN

## 2013-02-13 MED ORDER — PROMETHAZINE HCL 25 MG PO TABS: 25.0000 mg | ORAL_TABLET | Freq: Four times a day (QID) | ORAL | Status: DC | PRN

## 2013-02-13 NOTE — Telephone Encounter (Signed)
Called patient and there was no answer. 

## 2013-02-13 NOTE — MAU Note (Signed)
Patient to MAU for follow up after ultrasound. Patient states she is a little crampy but no bleeding

## 2013-02-13 NOTE — MAU Provider Note (Signed)
[name] is a 37 y.o. [id number] at [redacted]w[redacted]d who presents to MAU today for follow-up US results. Patient states that she has been having cramps that come and go and seem to be worsening. She is having spotting today. States heavier bleeding yesterday. No N/V or fever.   BP 112/73  Pulse 78  Temp(Src) 98.1 F (36.7 C) (Oral)  Resp 16  SpO2 100%  LMP 12/04/2012 GENERAL: Well-developed, well-nourished female in no acute distress.  HEENT: Normocephalic, atraumatic.   LUNGS: Effort normal HEART: Regular rate  SKIN: Warm, dry and without erythema PSYCH: Normal mood and affect   Results for orders placed during the hospital encounter of 02/13/13 (from the past 24 hour(s))  HCG, QUANTITATIVE, PREGNANCY     Status: Abnormal   Collection Time    02/13/13 11:33 AM      Result Value Range   hCG, Beta Chain, Quant, S 13510 (*) <5 mIU/mL  CBC     Status: None   Collection Time    02/13/13 11:34 AM      Result Value Range   WBC 7.0  4.0 - 10.5 K/uL   RBC 4.12  3.87 - 5.11 MIL/uL   Hemoglobin 12.8  12.0 - 15.0 g/dL   HCT 29.5  62.1 - 30.8 %   MCV 90.8  78.0 - 100.0 fL   MCH 31.1  26.0 - 34.0 pg   MCHC 34.2  30.0 - 36.0 g/dL   RDW 65.7  84.6 - 96.2 %   Platelets 297  150 - 400 K/uL    MDM Discussed [place] results with Dr. Erin Fulling. Patient is a candidate for cytotec if she wishes. Follow-up in [facility] clinic.  Quant hCG and CBC drawn Discussed options of expectant management vs cytotec. The patient would like Rx for Cytotec, but is unsure if she will use it yet.   A: IUFD at 6w 4d  P: Discharge home Rx for Cytotec, Phenergan, Ibuprofen and Percocet sent/given to patient's pharmacy Bleeding precautions discussed Patient will follow-up in GYN clinic in ~ 2 weeks Patient may return to MAU as needed or if her condition were to change or worsen  Freddi Starr, PA-C 02/13/2013 11:05 AM

## 2013-02-13 NOTE — Telephone Encounter (Signed)
Attempted to contact patient. No answer. Left message to call clinic back.

## 2013-02-13 NOTE — Telephone Encounter (Signed)
Pt went to [place] appt.

## 2013-02-15 NOTE — MAU Provider Note (Signed)
Attestation of Attending Supervision of Advanced Practitioner (CNM/NP): Evaluation and management procedures were performed by the Advanced Practitioner under my supervision and collaboration.  I have reviewed the Advanced Practitioner's note and chart, and I agree with the management and plan.  Shahzad Thomann 02/15/2013 8:26 AM   

## 2013-02-23 ENCOUNTER — Other Ambulatory Visit: Payer: Medicaid Other

## 2013-02-23 DIAGNOSIS — O039 Complete or unspecified spontaneous abortion without complication: Secondary | ICD-10-CM

## 2013-02-24 LAB — HCG, QUANTITATIVE, PREGNANCY: hCG, Beta Chain, Quant, S: 5040.6 m[IU]/mL

## 2013-02-27 ENCOUNTER — Encounter: Payer: Medicaid Other | Admitting: Obstetrics and Gynecology

## 2013-02-27 ENCOUNTER — Encounter (HOSPITAL_COMMUNITY): Payer: Self-pay | Admitting: *Deleted

## 2013-02-27 ENCOUNTER — Encounter (HOSPITAL_COMMUNITY): Payer: Self-pay | Admitting: Anesthesiology

## 2013-02-27 ENCOUNTER — Inpatient Hospital Stay (HOSPITAL_COMMUNITY): Payer: Medicaid Other | Admitting: Anesthesiology

## 2013-02-27 ENCOUNTER — Encounter (HOSPITAL_COMMUNITY)
Admission: AD | Disposition: A | Discharge status: Home / Self Care | Payer: Self-pay | Source: Ambulatory Visit | Attending: Family Medicine

## 2013-02-27 ENCOUNTER — Inpatient Hospital Stay (HOSPITAL_COMMUNITY): Payer: Medicaid Other

## 2013-02-27 ENCOUNTER — Inpatient Hospital Stay (HOSPITAL_COMMUNITY)
Admission: AD | Admit: 2013-02-27 | Discharge: 2013-02-27 | Disposition: A | Discharge status: Home / Self Care | Payer: Medicaid Other | Source: Ambulatory Visit | Attending: Family Medicine | Admitting: Family Medicine

## 2013-02-27 DIAGNOSIS — O036 Delayed or excessive hemorrhage following complete or unspecified spontaneous abortion: Secondary | ICD-10-CM | POA: Diagnosis present

## 2013-02-27 DIAGNOSIS — O034 Incomplete spontaneous abortion without complication: Secondary | ICD-10-CM | POA: Insufficient documentation

## 2013-02-27 HISTORY — DX: Depression, unspecified: F32.A

## 2013-02-27 HISTORY — DX: Calculus of kidney: N20.0

## 2013-02-27 HISTORY — DX: Major depressive disorder, single episode, unspecified: F32.9

## 2013-02-27 HISTORY — DX: Unspecified infectious disease: B99.9

## 2013-02-27 HISTORY — DX: Reserved for concepts with insufficient information to code with codable children: IMO0002

## 2013-02-27 HISTORY — DX: Unspecified abnormal cytological findings in specimens from cervix uteri: R87.619

## 2013-02-27 HISTORY — PX: DILATION AND EVACUATION: SHX1459

## 2013-02-27 LAB — HCG, QUANTITATIVE, PREGNANCY: hCG, Beta Chain, Quant, S: 2101 m[IU]/mL — ABNORMAL HIGH (ref <5)

## 2013-02-27 LAB — CBC
Platelets: 274 10*3/uL (ref 150–400)
RBC: 3.77 MIL/uL — ABNORMAL LOW (ref 3.87–5.11)
RDW: 12 % (ref 11.5–15.5)
WBC: 6.4 10*3/uL (ref 4.0–10.5)

## 2013-02-27 LAB — TYPE AND SCREEN: Antibody Screen: NEGATIVE

## 2013-02-27 SURGERY — DILATION AND EVACUATION, UTERUS
Anesthesia: General | Site: Uterus | Wound class: Clean Contaminated

## 2013-02-27 MED ORDER — MIDAZOLAM HCL 2 MG/2ML IJ SOLN: 0.5000 mg | Freq: Once | INTRAMUSCULAR | Status: DC | PRN

## 2013-02-27 MED ORDER — PROMETHAZINE HCL 25 MG/ML IJ SOLN: 6.2500 mg | INTRAMUSCULAR | Status: DC | PRN

## 2013-02-27 MED ORDER — LIDOCAINE HCL (CARDIAC) 20 MG/ML IV SOLN
INTRAVENOUS | Status: DC | PRN
  Administered 2013-02-27: 30 mg via INTRAVENOUS

## 2013-02-27 MED ORDER — LACTATED RINGERS IV BOLUS (SEPSIS)
1000.0000 mL | Freq: Once | INTRAVENOUS | Status: AC
  Administered 2013-02-27 (×2): 1000 mL via INTRAVENOUS

## 2013-02-27 MED ORDER — OXYCODONE-ACETAMINOPHEN 5-325 MG PO TABS
1.0000 | ORAL_TABLET | ORAL | Status: DC | PRN
  Administered 2013-02-27: 1 via ORAL

## 2013-02-27 MED ORDER — DEXAMETHASONE SODIUM PHOSPHATE 4 MG/ML IJ SOLN
INTRAMUSCULAR | Status: DC | PRN
  Administered 2013-02-27: 10 mg via INTRAVENOUS

## 2013-02-27 MED ORDER — FAMOTIDINE IN NACL 20-0.9 MG/50ML-% IV SOLN
INTRAVENOUS | Status: AC
  Administered 2013-02-27: 20 mg via INTRAVENOUS
  Filled 2013-02-27: qty 50

## 2013-02-27 MED ORDER — FLUCONAZOLE 150 MG PO TABS: 150.0000 mg | ORAL_TABLET | Freq: Once | ORAL | Status: DC

## 2013-02-27 MED ORDER — SUCCINYLCHOLINE CHLORIDE 20 MG/ML IJ SOLN
INTRAMUSCULAR | Status: DC | PRN
  Administered 2013-02-27: 100 mg via INTRAVENOUS

## 2013-02-27 MED ORDER — KETOROLAC TROMETHAMINE 30 MG/ML IJ SOLN: 15.0000 mg | Freq: Once | INTRAMUSCULAR | Status: DC | PRN

## 2013-02-27 MED ORDER — MEPERIDINE HCL 25 MG/ML IJ SOLN
6.2500 mg | INTRAMUSCULAR | Status: DC | PRN
  Administered 2013-02-27: 12.5 mg via INTRAVENOUS

## 2013-02-27 MED ORDER — CITRIC ACID-SODIUM CITRATE 334-500 MG/5ML PO SOLN: 30.0000 mL | Freq: Once | ORAL | Status: AC

## 2013-02-27 MED ORDER — ONDANSETRON HCL 4 MG/2ML IJ SOLN
INTRAMUSCULAR | Status: DC | PRN
  Administered 2013-02-27: 4 mg via INTRAVENOUS

## 2013-02-27 MED ORDER — BUPIVACAINE-EPINEPHRINE 0.25% -1:200000 IJ SOLN
INTRAMUSCULAR | Status: DC | PRN
  Administered 2013-02-27: 20 mL

## 2013-02-27 MED ORDER — FENTANYL CITRATE 0.05 MG/ML IJ SOLN
INTRAMUSCULAR | Status: DC | PRN
  Administered 2013-02-27: 25 ug via INTRAVENOUS

## 2013-02-27 MED ORDER — FENTANYL CITRATE 0.05 MG/ML IJ SOLN
25.0000 ug | INTRAMUSCULAR | Status: DC | PRN
  Administered 2013-02-27 (×2): 25 ug via INTRAVENOUS

## 2013-02-27 MED ORDER — HYDROMORPHONE HCL PF 1 MG/ML IJ SOLN
1.0000 mg | Freq: Once | INTRAMUSCULAR | Status: AC
  Administered 2013-02-27: 1 mg via INTRAMUSCULAR
  Filled 2013-02-27: qty 1

## 2013-02-27 MED ORDER — FAMOTIDINE IN NACL 20-0.9 MG/50ML-% IV SOLN: 20.0000 mg | Freq: Once | INTRAVENOUS | Status: AC

## 2013-02-27 MED ORDER — LACTATED RINGERS IV SOLN
INTRAVENOUS | Status: DC | PRN
  Administered 2013-02-27: 11:00:00 via INTRAVENOUS

## 2013-02-27 MED ORDER — DOXYCYCLINE HYCLATE 100 MG PO CAPS: 100.0000 mg | ORAL_CAPSULE | Freq: Two times a day (BID) | ORAL | Status: DC

## 2013-02-27 MED ORDER — MIDAZOLAM HCL 5 MG/5ML IJ SOLN
INTRAMUSCULAR | Status: DC | PRN
  Administered 2013-02-27: 2 mg via INTRAVENOUS

## 2013-02-27 MED ORDER — CITRIC ACID-SODIUM CITRATE 334-500 MG/5ML PO SOLN
ORAL | Status: AC
  Administered 2013-02-27: 30 mL via ORAL
  Filled 2013-02-27: qty 15

## 2013-02-27 MED ORDER — PROPOFOL 10 MG/ML IV BOLUS
INTRAVENOUS | Status: DC | PRN
  Administered 2013-02-27: 150 mg via INTRAVENOUS

## 2013-02-27 MED ORDER — FENTANYL CITRATE 0.05 MG/ML IJ SOLN
INTRAMUSCULAR | Status: AC
  Administered 2013-02-27: 25 ug via INTRAVENOUS
  Filled 2013-02-27: qty 2

## 2013-02-27 SURGICAL SUPPLY — 22 items
CATH ROBINSON RED A/P 16FR (CATHETERS) ×2 IMPLANT
CLOTH BEACON ORANGE TIMEOUT ST (SAFETY) ×2 IMPLANT
DECANTER SPIKE VIAL GLASS SM (MISCELLANEOUS) ×2 IMPLANT
GLOVE BIOGEL PI IND STRL 7.0 (GLOVE) ×1 IMPLANT
GLOVE BIOGEL PI INDICATOR 7.0 (GLOVE) ×1
GLOVE ECLIPSE 7.0 STRL STRAW (GLOVE) ×4 IMPLANT
GOWN PREVENTION PLUS XLARGE (GOWN DISPOSABLE) ×2 IMPLANT
GOWN STRL REIN XL XLG (GOWN DISPOSABLE) ×4 IMPLANT
KIT BERKELEY 1ST TRIMESTER 3/8 (MISCELLANEOUS) ×2 IMPLANT
NDL SPNL 22GX3.5 QUINCKE BK (NEEDLE) ×1 IMPLANT
NEEDLE SPNL 22GX3.5 QUINCKE BK (NEEDLE) ×2 IMPLANT
NS IRRIG 1000ML POUR BTL (IV SOLUTION) ×2 IMPLANT
PACK VAGINAL MINOR WOMEN LF (CUSTOM PROCEDURE TRAY) ×2 IMPLANT
PAD OB MATERNITY 4.3X12.25 (PERSONAL CARE ITEMS) ×2 IMPLANT
PAD PREP 24X48 CUFFED NSTRL (MISCELLANEOUS) ×2 IMPLANT
SET BERKELEY SUCTION TUBING (SUCTIONS) ×2 IMPLANT
SYR CONTROL 10ML LL (SYRINGE) ×2 IMPLANT
TOWEL OR 17X24 6PK STRL BLUE (TOWEL DISPOSABLE) ×4 IMPLANT
VACURETTE 10 RIGID CVD (CANNULA) IMPLANT
VACURETTE 7MM CVD STRL WRAP (CANNULA) IMPLANT
VACURETTE 8 RIGID CVD (CANNULA) IMPLANT
VACURETTE 9 RIGID CVD (CANNULA) IMPLANT

## 2013-02-27 NOTE — MAU Note (Signed)
Barbara Tyler anes, at bedside discussing medication crossing into breast milk.  Use of spinal vs general anes.

## 2013-02-27 NOTE — MAU Note (Addendum)
Sudden onset of bleeding upon arrival at clinic.  Had been cramping about before onset, took some motrin at that time.  Had not taken cytotec; had a lot of bleeding on Monday, passed some large clots.

## 2013-02-27 NOTE — Op Note (Signed)
Barbara Tyler  PROCEDURE DATE: 02/27/2013  PREOPERATIVE DIAGNOSIS: 6 week incomplete abortion.  POSTOPERATIVE DIAGNOSIS: The same.  PROCEDURE:    Suction Dilation and Evacuation.  SURGEON:  Aldan Camey S  ANESTHESIA: Brayton Caves, MD GETT  INDICATIONS: 37 y.o. [id number] MAB at [redacted] weeks gestation, needing surgical completion.  Risks of surgery were discussed with the patient including but not limited to: bleeding which may require transfusion; infection which may require antibiotics; injury to uterus or surrounding organs;need for additional procedures including laparotomy or laparoscopy; possibility of intrauterine scarring which may impair future fertility; and other postoperative/anesthesia complications. Written informed consent was obtained.    FINDINGS:  A 8 wk size anteverted/midline/retroverted uterus, moderate amounts of products of conception, specimen sent to pathology.  ANESTHESIA:    Monitored intravenous sedation, paracervical block.  ESTIMATED BLOOD LOSS:  Less than 20 ml.  SPECIMENS:  Products of conception sent to pathology  COMPLICATIONS:  None immediate.  PROCEDURE DETAILS:  The patient received intravenous antibiotics while in the preoperative area.  She was then taken to the operating room where general anesthesia was administered and was found to be adequate.  After an adequate timeout was performed, she was placed in the dorsal lithotomy position and examined; then prepped and draped in the sterile manner.   Her bladder was catheterized for an unmeasured amount of clear, yellow urine. A vaginal speculum was then placed in the patient's vagina and a single tooth tenaculum was applied to the anterior lip of the cervix.  A paracervical block using 1% Lidocaine with Epinephrine was administered. The cervix was gently dilated to accommodate a 9 F mm suction curette that was gently advanced to the uterine fundus.  The suction device was then activated and curette slowly  rotated to clear the uterus of products of conception.  A sharp curettage was then performed to confirm complete emptying of the uterus.There was minimal bleeding noted and the tenaculum removed with good hemostasis noted. The patient tolerated the procedure well.  The patient was taken to the recovery area in stable condition.  Advay Volante S 02/27/2013 11:15 AM

## 2013-02-27 NOTE — MAU Note (Signed)
Pt "felt weird' after dilaudid.  Dizzy.felt like hard to catch breath

## 2013-02-27 NOTE — Anesthesia Preprocedure Evaluation (Signed)
Anesthesia Evaluation  Patient identified by MRN, date of birth, ID band Patient awake    Reviewed: Allergy & Precautions, H&P , Patient's Chart, lab work & pertinent test results, reviewed documented beta blocker date and time   History of Anesthesia Complications Negative for: history of anesthetic complications  Airway Mallampati: II TM Distance: >3 FB Neck ROM: full    Dental no notable dental hx.    Pulmonary neg pulmonary ROS,  breath sounds clear to auscultation  Pulmonary exam normal       Cardiovascular Exercise Tolerance: Good negative cardio ROS  Rhythm:regular Rate:Normal     Neuro/Psych negative neurological ROS  negative psych ROS   GI/Hepatic negative GI ROS, Neg liver ROS,   Endo/Other  negative endocrine ROS  Renal/GU negative Renal ROS     Musculoskeletal   Abdominal   Peds  Hematology negative hematology ROS (+)   Anesthesia Other Findings   Reproductive/Obstetrics negative OB ROS                           Anesthesia Physical Anesthesia Plan  ASA: II and emergent  Anesthesia Plan: General ETT   Post-op Pain Management:    Induction: Rapid sequence, Cricoid pressure planned and Intravenous  Airway Management Planned: Oral ETT  Additional Equipment:   Intra-op Plan:   Post-operative Plan:   Informed Consent: I have reviewed the patients History and Physical, chart, labs and discussed the procedure including the risks, benefits and alternatives for the proposed anesthesia with the patient or authorized representative who has indicated his/her understanding and acceptance.   Dental Advisory Given  Plan Discussed with: CRNA and Surgeon  Anesthesia Plan Comments:         Anesthesia Quick Evaluation

## 2013-02-27 NOTE — MAU Note (Signed)
Pt was down at clinic for f/u- dx with inevitable SAB. Started having heavy bleeding- was brought to MAU via WC

## 2013-02-27 NOTE — MAU Provider Note (Signed)
History     CSN: [id number]  Arrival date and time: 02/27/13 4098   First Provider Initiated Contact with Patient 02/27/13 [id number]      Chief Complaint  Patient presents with  . Vaginal Bleeding   HPI [name] is a 37 y.o. [id number] who was diagnosed with a failed pregnancy at 6w 1d on 02/13/13. The patient states that she did not take the cytotec. She had some bleeding starting last Friday that was lighter and then Monday had heavy bleeding with clots. She had another episode of very heavy bleeding on Wednesday as well. Her bleeding was moderate yesterday and then this morning on her way into the clinic she had a huge gush. She states that she is feeling somewhat weak and shaky. She is also having lower abdominal pain that she rates at 8/10 now.   OB History   Grav Para Term Preterm Abortions TAB SAB Ect Mult Living   6 2 1 1 3  3   2       Past Medical History  Diagnosis Date  . Preterm labor   . Infection     UTI  . Kidney stones   . Abnormal Pap smear     cryo, clean since  . Depression     Past Surgical History  Procedure Laterality Date  . No past surgeries    . Cryotherapy      Family History  Problem Relation Age of Onset  . Cancer Father   . Cancer Maternal Grandmother     breast- great grandma    History  Substance Use Topics  . Smoking status: Never Smoker   . Smokeless tobacco: Never Used  . Alcohol Use: Yes     Comment: rare    Allergies:  Allergies  Allergen Reactions  . Latex Itching and Rash    Burning of skin    Prescriptions prior to admission  Medication Sig Dispense Refill  . ibuprofen (ADVIL,MOTRIN) 600 MG tablet Take 1 tablet (600 mg total) by mouth every 6 (six) hours as needed for pain.  30 tablet  0  . oxyCODONE-acetaminophen (PERCOCET/ROXICET) 5-325 MG per tablet Take 1-2 tablets by mouth every 4 (four) hours as needed for pain.  15 tablet  0  . Prenatal Vit-Fe Fumarate-FA (PRENATAL MULTIVITAMIN) TABS Take 1 tablet by  mouth at bedtime.      . promethazine (PHENERGAN) 25 MG tablet Take 1 tablet (25 mg total) by mouth every 6 (six) hours as needed for nausea.  30 tablet  0    Review of Systems  Constitutional: Negative for malaise/fatigue.  Gastrointestinal: Positive for abdominal pain.  Genitourinary:       + vaginal bleeding  Neurological: Positive for dizziness and weakness. Negative for loss of consciousness.   Physical Exam   Blood pressure 108/62, pulse 62, temperature 98 F (36.7 C), temperature source Oral, resp. rate 18, last menstrual period 12/04/2012, SpO2 100.00%.  Physical Exam  Constitutional: She is oriented to person, place, and time. She appears well-developed and well-nourished.  Patient appears uncomfortable  HENT:  Head: Normocephalic and atraumatic.  Cardiovascular: Tachycardia present.   Respiratory: Effort normal.  Neurological: She is alert and oriented to person, place, and time.  Skin: Skin is warm and dry. No erythema. There is pallor.  Psychiatric: She has a normal mood and affect.   Results for orders placed during the hospital encounter of 02/27/13 (from the past 24 hour(s))  HCG, QUANTITATIVE, PREGNANCY  Status: Abnormal   Collection Time    02/27/13  8:43 AM      Result Value Range   hCG, Beta Chain, Quant, S 2101 (*) <5 mIU/mL  CBC     Status: Abnormal   Collection Time    02/27/13  8:44 AM      Result Value Range   WBC 6.4  4.0 - 10.5 K/uL   RBC 3.77 (*) 3.87 - 5.11 MIL/uL   Hemoglobin 11.6 (*) 12.0 - 15.0 g/dL   HCT 16.1 (*) 09.6 - 04.5 %   MCV 91.5  78.0 - 100.0 fL   MCH 30.8  26.0 - 34.0 pg   MCHC 33.6  30.0 - 36.0 g/dL   RDW 40.9  81.1 - 91.4 %   Platelets 274  150 - 400 K/uL    MAU Course  Procedures None  MDM Discussed patient with Dr. Shawnie Pons.  Dr. Shawnie Pons to MAU to discuss [place] results with the patient. Will go to OR for D&C  Assessment and Plan  A: Incomplete SAB  P: Patient to OR for D&C  Freddi Starr, PA-C 02/27/2013, 10:54 AM

## 2013-02-27 NOTE — Transfer of Care (Signed)
Immediate Anesthesia Transfer of Care Note  Patient: Barbara Tyler  Procedure(s) Performed: Procedure(s): DILATATION AND EVACUATION (N/A)  Patient Location: PACU  Anesthesia Type:General  Level of Consciousness: awake, alert  and patient cooperative  Airway & Oxygen Therapy: Patient Spontanous Breathing  Post-op Assessment: Report given to PACU RN and Post -op Vital signs reviewed and stable  Post vital signs: Reviewed  Complications: No apparent anesthesia complications

## 2013-02-27 NOTE — MAU Note (Signed)
Reassurred, o2 sat, calmer.  States is sensative to meds, percocet knocked her on her but.

## 2013-02-27 NOTE — Anesthesia Procedure Notes (Signed)
Procedure Name: Intubation Performed by: Heena Woodbury G Pre-anesthesia Checklist: Patient identified, Patient being monitored, Timeout performed, Emergency Drugs available and Suction available Patient Re-evaluated:Patient Re-evaluated prior to inductionOxygen Delivery Method: Circle system utilized Preoxygenation: Pre-oxygenation with 100% oxygen Intubation Type: Rapid sequence and Cricoid Pressure applied Laryngoscope Size: 2 Grade View: Grade I Tube type: Oral Tube size: 7.0 mm Number of attempts: 1 Placement Confirmation: ETT inserted through vocal cords under direct vision,  breath sounds checked- equal and bilateral and positive ETCO2 Dental Injury: Teeth and Oropharynx as per pre-operative assessment

## 2013-02-27 NOTE — Anesthesia Postprocedure Evaluation (Signed)
  Anesthesia Post-op Note  Anesthesia Post Note  Patient: Barbara Tyler  Procedure(s) Performed: Procedure(s) (LRB): DILATATION AND EVACUATION (N/A)  Anesthesia type: MAC  Patient location: PACU  Post pain: Pain level controlled  Post assessment: Post-op Vital signs reviewed  Last Vitals:  Filed Vitals:   02/27/13 1200  BP: 102/58  Pulse: 88  Temp: 36.6 C  Resp: 11    Post vital signs: Reviewed  Level of consciousness: sedated  Complications: No apparent anesthesia complications

## 2013-02-27 NOTE — MAU Note (Signed)
meds discussed, pt is breast feeding, asked to feed her child prior to getting pain medication.

## 2013-02-27 NOTE — MAU Provider Note (Signed)
Chart reviewed and agree with management and plan.  

## 2013-02-27 NOTE — MAU Note (Signed)
Bedside U/S.

## 2013-02-27 NOTE — H&P (Signed)
Reva Bores, MD Physician Signed  MAU Provider Note Service date: 02/27/2013 8:42 AM  Chart reviewed and agree with management and plan.     Original Note: Freddi Starr, PA-C    [02/27/2013 8:42 AM]      Freddi Starr, PA-C PHYSICIAN ASSISTANT CERTIFIED Cosign Needed  MAU Provider Note Service date: 02/27/2013 8:42 AM        Chief Complaint   Patient presents with   .  Vaginal Bleeding    HPI [name] is a 37 y.o. [id number] who was diagnosed with a failed pregnancy at 6w 1d on 02/13/13. The patient states that she did not take the cytotec. She had some bleeding starting last Friday that was lighter and then Monday had heavy bleeding with clots. She had another episode of very heavy bleeding on Wednesday as well. Her bleeding was moderate yesterday and then this morning on her way into the clinic she had a huge gush. She states that she is feeling somewhat weak and shaky. She is also having lower abdominal pain that she rates at 8/10 now.     OB History     Grav  Para  Term  Preterm  Abortions  TAB  SAB  Ect  Mult  Living     6  2  1  1  3    3      2       Past Medical History   Diagnosis  Date   .  Preterm labor     .  Infection         UTI   .  Kidney stones     .  Abnormal Pap smear         cryo, clean since   .  Depression        Past Surgical History   Procedure  Laterality  Date   .  No past surgeries       .  Cryotherapy          Family History   Problem  Relation  Age of Onset   .  Cancer  Father     .  Cancer  Maternal Grandmother         breast- great grandma    History   Substance Use Topics   .  Smoking status:  Never Smoker    .  Smokeless tobacco:  Never Used   .  Alcohol Use:  Yes         Comment: rare    Allergies:   Allergen  Reactions   .  Latex  Itching and Rash       Burning of skin    Medication  Sig  Dispense  Refill   .  ibuprofen (ADVIL,MOTRIN) 600 MG tablet  Take 1 tablet (600 mg total) by mouth every 6 (six) hours as needed  for pain.   30 tablet   0   .  oxyCODONE-acetaminophen (PERCOCET/ROXICET) 5-325 MG per tablet  Take 1-2 tablets by mouth every 4 (four) hours as needed for pain.   15 tablet   0   .  Prenatal Vit-Fe Fumarate-FA (PRENATAL MULTIVITAMIN) TABS  Take 1 tablet by mouth at bedtime.         .  promethazine (PHENERGAN) 25 MG tablet  Take 1 tablet (25 mg total) by mouth every 6 (six) hours as needed for nausea.   30 tablet   0    Review of  Systems  Constitutional: Negative for malaise/fatigue.  Gastrointestinal: Positive for abdominal pain.  Genitourinary:        + vaginal bleeding  Neurological: Positive for dizziness and weakness. Negative for loss of consciousness.   Physical Exam    Blood pressure 108/62, pulse 62, temperature 98 F (36.7 C), temperature source Oral, resp. rate 18, last menstrual period 12/04/2012, SpO2 100.00%. Constitutional: She is oriented to person, place, and time. She appears well-developed and well-nourished.  Patient appears uncomfortable  HENT:   Head: Normocephalic and atraumatic.  Cardiovascular: Tachycardia present.   Respiratory: Effort normal.  Neurological: She is alert and oriented to person, place, and time.  Skin: Skin is warm and dry. No erythema. There is pallor.  Psychiatric: She has a normal mood and affect.  Results for orders placed during the hospital encounter of 02/27/13 (from the past 24 hour(s))   HCG, QUANTITATIVE, PREGNANCY     Status: Abnormal     Collection Time      02/27/13  8:43 AM       Result  Value  Range     hCG, Beta Chain, Quant, S  2101 (*)  <5 mIU/mL   CBC     Status: Abnormal     Collection Time      02/27/13  8:44 AM       Result  Value  Range     WBC  6.4   4.0 - 10.5 K/uL     RBC  3.77 (*)  3.87 - 5.11 MIL/uL     Hemoglobin  11.6 (*)  12.0 - 15.0 g/dL     HCT  40.9 (*)  81.1 - 46.0 %     MCV  91.5   78.0 - 100.0 fL     MCH  30.8   26.0 - 34.0 pg     MCHC  33.6   30.0 - 36.0 g/dL     RDW  91.4   78.2 - 15.5 %      Platelets  274   150 - 400 K/uL    U/s shows elongated gestational sac above cervix.  Assesment: Incomplete SAB   Plan: Patient to OR for D&C

## 2013-03-02 ENCOUNTER — Telehealth: Payer: Self-pay | Admitting: *Deleted

## 2013-03-02 NOTE — Telephone Encounter (Signed)
Patient left a message stating that she had a D&C on Friday and took the percocet when she got home it made her very itchy so she didn't take anymore. She wanted to know if there was something else she should take.

## 2013-03-02 NOTE — Telephone Encounter (Signed)
Called Barbara Tyler and left a message we are returning her call- please call clinic again if you still need asistance. .  Per chart review also has ibuprofen 600mg  ordered - need to ask if she is taking that and if it is relieving her pain.

## 2013-03-03 ENCOUNTER — Encounter: Payer: Self-pay | Admitting: Obstetrics & Gynecology

## 2013-03-03 ENCOUNTER — Encounter (HOSPITAL_COMMUNITY): Payer: Self-pay | Admitting: Family Medicine

## 2013-03-03 NOTE — Telephone Encounter (Signed)
Called pt regarding her pain med question. She stated that she has talked with someone from PACU and they called in hydrocodone for her. I told pt that she can also take the ibuprofen for less severe pain or between doses of narcotic if necessary.  Pt voiced understanding.

## 2013-03-23 ENCOUNTER — Ambulatory Visit (INDEPENDENT_AMBULATORY_CARE_PROVIDER_SITE_OTHER): Payer: Medicaid Other | Admitting: Family Medicine

## 2013-03-23 ENCOUNTER — Encounter: Payer: Self-pay | Admitting: Family Medicine

## 2013-03-23 VITALS — BP 101/69 | HR 87 | Temp 97.8°F | Ht 62.0 in | Wt 129.3 lb

## 2013-03-23 DIAGNOSIS — N96 Recurrent pregnancy loss: Secondary | ICD-10-CM

## 2013-03-23 DIAGNOSIS — Z09 Encounter for follow-up examination after completed treatment for conditions other than malignant neoplasm: Secondary | ICD-10-CM

## 2013-03-23 DIAGNOSIS — F329 Major depressive disorder, single episode, unspecified: Secondary | ICD-10-CM

## 2013-03-23 LAB — HEMOGLOBIN A1C
Hgb A1c MFr Bld: 5.1 % (ref <5.7)
Mean Plasma Glucose: 100 mg/dL (ref <117)

## 2013-03-23 MED ORDER — SERTRALINE HCL 50 MG PO TABS: 50.0000 mg | ORAL_TABLET | Freq: Every day | ORAL | Status: DC

## 2013-03-23 NOTE — Assessment & Plan Note (Signed)
Treat with zoloft.

## 2013-03-23 NOTE — Assessment & Plan Note (Addendum)
Will do initial w/u to include APLA, Factor V Leiden, DM and Thyroid.  If has another, consider maternal and paternal chromosomes.

## 2013-03-23 NOTE — Patient Instructions (Addendum)
Recurrent Miscarriage Recurrent miscarriage means that a woman has lost two or more pregnancies in a row. The loss happens before 20 weeks of pregnancy. Primary recurrent miscarriage is with a woman that has never been able to give birth to a child. Secondary recurrent miscarriage is a woman who had a child and then had two or more miscarriages in a row.  CAUSES   Your parents passed it to you (genetic).  Chromosomal defects.  Endocrine disorders. A person's endocrine system includes glands that make hormones.  Having a lack of progesterone hormone in early pregnancy.  Thyroid problems.  Insulin resistance seen in diabetic women and women with Polycystic Ovary Syndrome that is hard to control.  Abnormalities of the uterus.  Certain viral and germ (bacterial) infections.  Autoimmune disorders. This is when the immune system attacks or destroys healthy body tissue.  Smoking, drinking or taking drugs or medicines.  Being exposed to certain chemicals or toxins at home or work. HOME CARE INSTRUCTIONS   See your caregiver if you have two or more miscarriages.  Follow the advice for testing and treatment that your caregiver recommends.  Discuss any concerns or questions with your caregiver.  Do not smoke or drink alcohol when pregnant.  Recurrent miscarriages can have a severe emotional and psychological effect on a couple wanting to have children. They may have feelings of anger, blame, guilt and become depressed after losing a pregnancy many times. Join a support group or see a grief counselor if you have emotional problems because of pregnancy loss. SEEK MEDICAL CARE IF:   You are or think you are pregnant and have any kind of vaginal spotting or bleeding.  You develop low abdominal cramps.  You develop a fever of 102 F (38.9 C) or higher.  You develop abnormal vaginal discharge.  You have been or think you have been exposed to a spreadable (contagious) illness, toxins or  chemicals that make you sick to your stomach (nauseated) or throw up (vomit).  You think you have been exposed to a sexually transmitted disease. Document Released: 02/27/2008 Document Revised: 12/03/2011 Document Reviewed: 02/27/2008 [facility] Patient Information 2014 [place], [place]. Depression, Adult Depression refers to feeling sad, low, down in the dumps, blue, gloomy, or empty. In general, there are two kinds of depression: 1. Depression that we all experience from time to time because of upsetting life experiences, including the loss of a job or the ending of a relationship (normal sadness or normal grief). This kind of depression is considered normal, is short lived, and resolves within a few days to 2 weeks. (Depression experienced after the loss of a loved one is called bereavement. Bereavement often lasts longer than 2 weeks but normally gets better with time.) 2. Clinical depression, which lasts longer than normal sadness or normal grief or interferes with your ability to function at home, at work, and in school. It also interferes with your personal relationships. It affects almost every aspect of your life. Clinical depression is an illness. Symptoms of depression also can be caused by conditions other than normal sadness and grief or clinical depression. Examples of these conditions are listed as follows:  Physical illness Some physical illnesses, including underactive thyroid gland (hypothyroidism), severe anemia, specific types of cancer, diabetes, uncontrolled seizures, heart and lung problems, strokes, and chronic pain are commonly associated with symptoms of depression.  Side effects of some prescription medicine In some people, certain types of prescription medicine can cause symptoms of depression.  Substance abuse Abuse  of alcohol and illicit drugs can cause symptoms of depression. SYMPTOMS Symptoms of normal sadness and normal grief include the following:  Feeling sad or  crying for short periods of time.  Not caring about anything (apathy).  Difficulty sleeping or sleeping too much.  No longer able to enjoy the things you used to enjoy.  Desire to be by oneself all the time (social isolation).  Lack of energy or motivation.  Difficulty concentrating or remembering.  Change in appetite or weight.  Restlessness or agitation. Symptoms of clinical depression include the same symptoms of normal sadness or normal grief and also the following symptoms:  Feeling sad or crying all the time.  Feelings of guilt or worthlessness.  Feelings of hopelessness or helplessness.  Thoughts of suicide or the desire to harm yourself (suicidal ideation).  Loss of touch with reality (psychotic symptoms). Seeing or hearing things that are not real (hallucinations) or having false beliefs about your life or the people around you (delusions and paranoia). DIAGNOSIS  The diagnosis of clinical depression usually is based on the severity and duration of the symptoms. Your caregiver also will ask you questions about your medical history and substance use to find out if physical illness, use of prescription medicine, or substance abuse is causing your depression. Your caregiver also may order blood tests. TREATMENT  Typically, normal sadness and normal grief do not require treatment. However, sometimes antidepressant medicine is prescribed for bereavement to ease the depressive symptoms until they resolve. The treatment for clinical depression depends on the severity of your symptoms but typically includes antidepressant medicine, counseling with a mental health professional, or a combination of both. Your caregiver will help to determine what treatment is best for you. Depression caused by physical illness usually goes away with appropriate medical treatment of the illness. If prescription medicine is causing depression, talk with your caregiver about stopping the medicine,  decreasing the dose, or substituting another medicine. Depression caused by abuse of alcohol or illicit drugs abuse goes away with abstinence from these substances. Some adults need professional help in order to stop drinking or using drugs. SEEK IMMEDIATE CARE IF:  You have thoughts about hurting yourself or others.  You lose touch with reality (have psychotic symptoms).  You are taking medicine for depression and have a serious side effect. FOR MORE INFORMATION National Alliance on Mental Illness: www.nami.[employer]: http://www.maynard.net/ Document Released: 09/07/2000 Document Revised: 03/11/2012 Document Reviewed: 12/10/2011 [facility] Patient Information 2014 [place], [place].

## 2013-03-23 NOTE — Progress Notes (Signed)
  Subjective:    Patient ID: Barbara Tyler, female    DOB: 1976/03/11, 37 y.o.   MRN: [id number]  HPI  Here for f/u.  Had incomplete AB, and D and C.  This makes 4th first trimester Ab.  Feels very depressed.  Early am rising.  Wants medication.  Previously on Zoloft after her mother's death.  Review of Systems  Constitutional: Negative for fever and chills.  Genitourinary: Negative for vaginal bleeding.  Musculoskeletal: Negative for back pain.  Skin: Negative for rash.       Objective:   Physical Exam  Vitals reviewed. Constitutional: She appears well-developed and well-nourished. No distress.  HENT:  Head: Normocephalic and atraumatic.  Eyes: No scleral icterus.  Neck: Neck supple.  Cardiovascular: Normal rate.   No murmur heard. Pulmonary/Chest: Effort normal.  Abdominal: Soft.          Assessment & Plan:

## 2013-03-24 LAB — LUPUS ANTICOAGULANT PANEL: PTT Lupus Anticoagulant: 27.4 secs — ABNORMAL LOW (ref 28.0–43.0)

## 2013-03-24 LAB — FACTOR 5 LEIDEN

## 2013-03-24 LAB — CARDIOLIPIN ANTIBODIES, IGM+IGG
Anticardiolipin IgG: 8 GPL U/mL (ref <23)
Anticardiolipin IgM: 3 MPL U/mL (ref <11)

## 2013-03-31 ENCOUNTER — Telehealth: Payer: Self-pay | Admitting: *Deleted

## 2013-03-31 NOTE — Telephone Encounter (Signed)
Called Barbara Tyler and informed her that Dr. Shawnie Pons would like her to come in to discuss test results. We have appt tomorrow @ 1315 available. Barbara Tyler voiced understanding and agreed to appt.

## 2013-04-01 ENCOUNTER — Ambulatory Visit (INDEPENDENT_AMBULATORY_CARE_PROVIDER_SITE_OTHER): Payer: Medicaid Other | Admitting: Family Medicine

## 2013-04-01 VITALS — BP 100/70 | HR 90 | Temp 98.1°F | Ht 64.0 in | Wt 126.0 lb

## 2013-04-01 DIAGNOSIS — D6859 Other primary thrombophilia: Secondary | ICD-10-CM

## 2013-04-01 DIAGNOSIS — N96 Recurrent pregnancy loss: Secondary | ICD-10-CM

## 2013-04-01 DIAGNOSIS — F329 Major depressive disorder, single episode, unspecified: Secondary | ICD-10-CM

## 2013-04-01 DIAGNOSIS — E038 Other specified hypothyroidism: Secondary | ICD-10-CM

## 2013-04-01 DIAGNOSIS — F32A Depression, unspecified: Secondary | ICD-10-CM

## 2013-04-01 DIAGNOSIS — D6851 Activated protein C resistance: Secondary | ICD-10-CM | POA: Insufficient documentation

## 2013-04-01 DIAGNOSIS — E039 Hypothyroidism, unspecified: Secondary | ICD-10-CM

## 2013-04-01 NOTE — Progress Notes (Signed)
  Subjective:    Patient ID: MERSEDES ALBER, female    DOB: 1976/08/25, 37 y.o.   MRN: [id number]  HPI  Here today to f/u results.  Has had 3 SAB's and w/u for coagulopathy and DM, TSH and Factor V Leiden, revealed no DM, mildly elevated TSH and heterozygous for Factor V Leiden.  No h/o clot but did have abruption with one pregnancy.  Review of Systems  Constitutional: Negative for fever and chills.  Genitourinary:       LMP 7/3-light       Objective:   Physical Exam  Vitals reviewed. Constitutional: She appears well-developed and well-nourished.  Cardiovascular: Normal rate.   Pulmonary/Chest: Effort normal.  Abdominal: Soft.          Assessment & Plan:  See problem list

## 2013-04-01 NOTE — Patient Instructions (Addendum)
Hypothyroidism The thyroid is a large gland located in the lower front of your neck. The thyroid gland helps control metabolism. Metabolism is how your body handles food. It controls metabolism with the hormone thyroxine. When this gland is underactive (hypothyroid), it produces too little hormone.  CAUSES These include:   Absence or destruction of thyroid tissue.  Goiter due to iodine deficiency.  Goiter due to medications.  Congenital defects (since birth).  Problems with the pituitary. This causes a lack of TSH (thyroid stimulating hormone). This hormone tells the thyroid to turn out more hormone. SYMPTOMS  Lethargy (feeling as though you have no energy)  Cold intolerance  Weight gain (in spite of normal food intake)  Dry skin  Coarse hair  Menstrual irregularity (if severe, may lead to infertility)  Slowing of thought processes Cardiac problems are also caused by insufficient amounts of thyroid hormone. Hypothyroidism in the newborn is cretinism, and is an extreme form. It is important that this form be treated adequately and immediately or it will lead rapidly to retarded physical and mental development. DIAGNOSIS  To prove hypothyroidism, your caregiver may do blood tests and ultrasound tests. Sometimes the signs are hidden. It may be necessary for your caregiver to watch this illness with blood tests either before or after diagnosis and treatment. TREATMENT  Low levels of thyroid hormone are increased by using synthetic thyroid hormone. This is a safe, effective treatment. It usually takes about four weeks to gain the full effects of the medication. After you have the full effect of the medication, it will generally take another four weeks for problems to leave. Your caregiver may start you on low doses. If you have had heart problems the dose may be gradually increased. It is generally not an emergency to get rapidly to normal. HOME CARE INSTRUCTIONS   Take your  medications as your caregiver suggests. Let your caregiver know of any medications you are taking or start taking. Your caregiver will help you with dosage schedules.  As your condition improves, your dosage needs may increase. It will be necessary to have continuing blood tests as suggested by your caregiver.  Report all suspected medication side effects to your caregiver. SEEK MEDICAL CARE IF: Seek medical care if you develop:  Sweating.  Tremulousness (tremors).  Anxiety.  Rapid weight loss.  Heat intolerance.  Emotional swings.  Diarrhea.  Weakness. SEEK IMMEDIATE MEDICAL CARE IF:  You develop chest pain, an irregular heart beat (palpitations), or a rapid heart beat. MAKE SURE YOU:   Understand these instructions.  Will watch your condition.  Will get help right away if you are not doing well or get worse. Document Released: 09/10/2005 Document Revised: 12/03/2011 Document Reviewed: 04/30/2008 [facility] Patient Information 2014 [place], [place].  Official reprint from UpToDate  www.uptodate.com    Patient information: Factor V Leiden (The Basics)Written by the doctors and Visual merchandiser at UpToDate  What is factor V Leiden? - Factor V Leiden is a condition that makes blood more likely to form clots in the legs, lungs, and other parts of the body.  Blood clots can be dangerous. If a clot forms inside a blood vessel, it can clog the vessel and keep blood from getting where it needs to go. If a clot travels to the lungs, it can cause breathing problems or even death. Factor V Leiden is a life-long condition that people are born with. It is caused by an abnormal gene. Parents pass the abnormal gene to their child. Often, parents might  not know they have the abnormal gene because they don't have any symptoms of the condition.  What are the symptoms of factor V Leiden? - Factor V Leiden does not cause symptoms unless a blood clot happens. Symptoms of a clot can include: Warmth,  redness, pain, and swelling in the leg - These are symptoms of a condition called "deep vein thrombosis" or "DVT." This is a blood clot in a vein deep inside the leg (figure 1).  Breathing problems, sharp chest pain, coughing, and fast heartbeat - These are symptoms of a condition called "pulmonary embolism." This is a blood clot in the lungs.  Headache, trouble moving or talking, and seizures - These can be symptoms of a type of stroke caused by factor V Leiden. Should I see a doctor or nurse? - See your doctor or nurse right away if you think you might have a blood clot in your leg. If you have symptoms of a stroke or a blood clot in the lungs, call [id number]. These conditions are very serious and can be life-threatening.  Is there a test for factor V Leiden? - Yes. If your doctor or nurse thinks you might have factor V Leiden, he or she can order a blood test to look for the abnormal gene.  How is factor V Leiden treated? - If factor V Leiden causes a blood clot, the main treatment is medicines that get rid of clots or keep them from getting bigger. Some of these medicines come in shots and others come in pills. Doctors sometimes do surgery to remove a blood clot in the lungs or help with brain problems from a stroke. People who have had a blood clot usually take a medicine called warfarin (brand name: Coumadin) by mouth for at least 3 months after the clot is found. This medicine helps keep new blood clots from forming. It is important because people who have 1 clot often have another later on.  Is there anything I can do to prevent blood clots? - Yes. People sometimes get clots because they have been sitting still for too long. Taking a long trip raises your risk of blood clots, especially if you fly. To help prevent a clot on a long plane trip, you can: Stand up and walk around every hour or 2  Not smoke just before a plane trip  Wear loose, comfortable clothes  Change your sitting position and move  your legs and feet often  Drink plenty of fluids  Wear knee-high compression stockings  Avoid alcohol and medicines that make you sleepy - These can keep you from moving around enough.  Women with factor V Leiden should ask a doctor or nurse if it is safe to take birth control pills. The hormones in these pills raise the risk of blood clots. This can be dangerous for women with factor V Leiden. What if I want to get pregnant or need surgery? - If you want to get pregnant or need surgery, tell your doctor or nurse if: You have had a blood clot  Your father, mother, brother, or sister had a blood clot before age 60 If the doctor or nurse thinks you might have factor V Leiden, he or she can order a blood test to look for it. If you have it, you might need to take medicine to help prevent blood clots and other problems.  More on this topic Patient information: Deep vein thrombosis (blood clots in the legs) (The Basics) Patient information: Pulmonary  embolism (blood clot in the lungs) (The Basics) Patient information: Stroke (The Basics) Patient information: Deep vein thrombosis (DVT) (Beyond the Basics) Patient information: Stroke symptoms and diagnosis (Beyond the Basics) Patient information: Warfarin (Coumadin) (Beyond the Basics) All topics are updated as new evidence becomes available and our peer review process is complete.  This topic retrieved from UpToDate on: Apr 01, 2013.

## 2013-04-01 NOTE — Assessment & Plan Note (Signed)
?   Related to thyroid.  Full TFT's today, if abnl refer to PCP.  Discussed implication of Factor V Leiden and possible anti-coagulation during pregnancy.  Not clearly indicated, but would be ok, if she desires.  Recommend baby ASA and possible MFM referral with next pregnancy.

## 2013-04-02 ENCOUNTER — Telehealth: Payer: Self-pay | Admitting: *Deleted

## 2013-04-02 LAB — T4, FREE: Free T4: 1.24 ng/dL (ref 0.80–1.80)

## 2013-04-02 LAB — TSH: TSH: 3.519 u[IU]/mL (ref 0.350–4.500)

## 2013-04-02 LAB — T3, FREE: T3, Free: 2.4 pg/mL (ref 2.3–4.2)

## 2013-04-02 NOTE — Assessment & Plan Note (Signed)
Unclear if this is truly subclinical or not--will repeat TSH and add Free T3 and Free T4. 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

## 2013-04-02 NOTE — Telephone Encounter (Addendum)
Message copied by Jill Side on Thu Apr 02, 2013  4:15 PM ------      Message from: Reva Bores      Created: Thu Apr 02, 2013  2:59 PM       Please call pt. And let her know that all labs on repeat were normal.  Her thyroid should probably be rechecked in 4-6 months to ensure.  May be resetting after pregnancy.  May be subclinical--i.e. We cannot measure yet. Her Free T3 is at low end of normal.  ------ Called pt to inform her of test results- she was not at home. I spoke with a female and informed him that I will call back tomorrow.       7/15  1500  Called pt and spoke w/a female who stated that she is not home.  I stated that I will try again later or tomorrow morning.       7/16   0925 Called and spoke w/pt. I informed her of thyroid function results and the recommendation from Dr. Shawnie Pons that she have them checked again in 4-6 mos.  Pt voiced understanding.

## 2013-04-02 NOTE — Assessment & Plan Note (Signed)
No h/o clot--just multiple SAB's.  Discussed current uptodate and CHEST guidelines, do not advise anti-coagulation with this with no h/o untoward 3rd trim. Event or clot.  Per pt. Had abruptio--which might qualify.  Advised baby ASA, early prenatal care and possible MFM referral.

## 2013-04-02 NOTE — Assessment & Plan Note (Signed)
Advised safety of Zoloft in breastfeeding.

## 2013-09-24 NOTE — L&D Delivery Note (Signed)
Patient is 38 y.o. [id number] admitted in active labor, hx of cerclage, anxiety  Delivery Note At 7:32 AM a viable female was delivered via Vaginal, Spontaneous Delivery (Presentation: ; Occiput Anterior).  APGAR: 8, 9; weight 7 lb 4.4 oz (3300 g).   Placenta status: Intact, Spontaneous.  Cord: 3 vessels with the following complications: true Knot.   Anesthesia: None  Episiotomy: None Lacerations: 1st degree;Perineal Suture Repair: n/a Est. Blood Loss (mL): 250  Mom to postpartum.  Baby to Couplet care / Skin to Skin.  No peripheral IV placed secondary to eminent delivery, declined pitocin IM and patient counseled accordingly of risk of postpartum hemorrhage.  [name] 08/12/2014, 10:36 AM

## 2013-09-29 ENCOUNTER — Telehealth: Payer: Self-pay | Admitting: General Practice

## 2013-09-29 NOTE — Telephone Encounter (Signed)
Called patient stating I was returning her phone call and would like to get an appt for her to speak with a  Doctor about her getting pregnant because they would likely want her to start medication. Patient verbalized understanding. Told patient I would send a message to the front office staff to call her with an appt for probably early February. Patient was satisfied, verbalized understanding and had no further questions

## 2013-09-29 NOTE — Telephone Encounter (Signed)
Patient called and left message stating that she was seen here in June for a miscarriage that led to a d/c and diagnosis of clotting disorders and other things and that now her and her husband are wanting to get pregnant but she knows she is high risk and just has some questions about how everything would work and what she would need to do and would like a call back soon.

## 2013-10-06 ENCOUNTER — Encounter: Payer: Self-pay | Admitting: Obstetrics & Gynecology

## 2013-10-30 ENCOUNTER — Ambulatory Visit (INDEPENDENT_AMBULATORY_CARE_PROVIDER_SITE_OTHER): Payer: Medicaid Other | Admitting: Obstetrics & Gynecology

## 2013-10-30 ENCOUNTER — Encounter: Payer: Self-pay | Admitting: Obstetrics & Gynecology

## 2013-10-30 VITALS — BP 100/77 | HR 97 | Ht 64.0 in | Wt 123.0 lb

## 2013-10-30 DIAGNOSIS — N96 Recurrent pregnancy loss: Secondary | ICD-10-CM

## 2013-10-30 NOTE — Progress Notes (Signed)
Patient ID: Barbara McmurrayBarbara L Guagliardo, female   DOB: 1975-10-31, 38 y.o.   MRN: [id number]  Chief Complaint  Patient presents with  . Advice Only    Wants to discuss getting pregnant    HPI Barbara McmurrayBarbara L Lagunes is a 38 y.o. female.  [id number] Patient's last menstrual period was 10/25/2013. Plans to conceive, wants to be sure to establish early prenatal care due to h/o recurrent Sab.  HPI  Past Medical History  Diagnosis Date  . Preterm labor   . Infection     UTI  . Kidney stones   . Abnormal Pap smear     cryo, clean since  . Depression     Past Surgical History  Procedure Laterality Date  . No past surgeries    . Cryotherapy    . Dilation and evacuation N/A 02/27/2013    Procedure: DILATATION AND EVACUATION;  Surgeon: Reva Boresanya S Pratt, MD;  Location: [facility];  Service: Gynecology;  Laterality: N/A;    Family History  Problem Relation Age of Onset  . Cancer Father   . Cancer Maternal Grandmother     breast- great grandma    Social History History  Substance Use Topics  . Smoking status: Never Smoker   . Smokeless tobacco: Never Used  . Alcohol Use: Yes     Comment: rare    Allergies  Allergen Reactions  . Latex Itching and Rash    Burning of skin    Current Outpatient Prescriptions  Medication Sig Dispense Refill  . atomoxetine (STRATTERA) 40 MG capsule Take 40 mg by mouth daily.      . Multiple Vitamin (MULTIVITAMIN) tablet Take 1 tablet by mouth daily.      . TURMERIC PO Take 2 capsules by mouth daily.       No current facility-administered medications for this visit.    Review of Systems Review of Systems  Constitutional: Negative.   Gastrointestinal: Negative.   Genitourinary: Negative for vaginal discharge and menstrual problem.    Blood pressure 100/77, pulse 97, height 5\' 4"  (1.626 m), weight 123 lb (55.792 kg), last menstrual period 10/25/2013.  Physical Exam Physical Exam  Constitutional: She is oriented to person, place, and time. She appears  well-developed and well-nourished. No distress.  Neurological: She is alert and oriented to person, place, and time.  Psychiatric: She has a normal mood and affect. Her behavior is normal.    Data Reviewed offic e notes  Assessment    No h/o clot--just multiple SAB's. Discussed current uptodate and CHEST guidelines, do not advise anti-coagulation with this with no h/o untoward 3rd trim. Event or clot. Per pt. Had abruptio--which might qualify. Advised baby ASA, early prenatal care and possible MFM referral. Per Dr. Shawnie PonsPratt     Plan    Call if positive pregnancy test for early visit and possible MFM        ARNOLD,JAMES 10/30/2013, 11:15 AM

## 2013-10-30 NOTE — Patient Instructions (Signed)
Recurrent Miscarriage  Recurrent miscarriage means that a woman has lost two or more pregnancies in a row. The loss happens before 20 weeks of pregnancy. Primary recurrent miscarriage is with a woman that has never been able to give birth to a child. Secondary recurrent miscarriage is a woman who had a child and then had two or more miscarriages in a row.   CAUSES   · Your parents passed it to you (genetic).  · Chromosomal defects.  · Endocrine disorders. A person's endocrine system includes glands that make hormones.  · Having a lack of progesterone hormone in early pregnancy.  · Thyroid problems.  · Insulin resistance seen in diabetic women and women with Polycystic Ovary Syndrome that is hard to control.  · Abnormalities of the uterus.  · Certain viral and germ (bacterial) infections.  · Autoimmune disorders. This is when the immune system attacks or destroys healthy body tissue.  · Smoking, drinking or taking drugs or medicines.  · Being exposed to certain chemicals or toxins at home or work.  HOME CARE INSTRUCTIONS   · See your caregiver if you have two or more miscarriages.  · Follow the advice for testing and treatment that your caregiver recommends.  · Discuss any concerns or questions with your caregiver.  · Do not smoke or drink alcohol when pregnant.  · Recurrent miscarriages can have a severe emotional and psychological effect on a couple wanting to have children. They may have feelings of anger, blame, guilt and become depressed after losing a pregnancy many times. Join a support group or see a grief counselor if you have emotional problems because of pregnancy loss.  SEEK MEDICAL CARE IF:   · You are or think you are pregnant and have any kind of vaginal spotting or bleeding.  · You develop low abdominal cramps.  · You develop a fever of 102° F (38.9° C) or higher.  · You develop abnormal vaginal discharge.  · You have been or think you have been exposed to a spreadable (contagious) illness, toxins or  chemicals that make you sick to your stomach (nauseated) or throw up (vomit).  · You think you have been exposed to a sexually transmitted disease.  Document Released: 02/27/2008 Document Revised: 12/03/2011 Document Reviewed: 02/27/2008  ExitCare® Patient Information ©2014 [employer].

## 2013-10-30 NOTE — Progress Notes (Signed)
Patient wants to discuss getting pregnant. She was told that she may need to be put on medication prior to getting pregnant this time, to help keep the pregnancy.

## 2013-12-14 ENCOUNTER — Ambulatory Visit (INDEPENDENT_AMBULATORY_CARE_PROVIDER_SITE_OTHER): Payer: Medicaid Other | Admitting: Obstetrics & Gynecology

## 2013-12-14 DIAGNOSIS — Z3200 Encounter for pregnancy test, result unknown: Secondary | ICD-10-CM

## 2013-12-14 DIAGNOSIS — N96 Recurrent pregnancy loss: Secondary | ICD-10-CM

## 2013-12-14 LAB — POCT PREGNANCY, URINE: PREG TEST UR: NEGATIVE

## 2013-12-14 NOTE — Progress Notes (Signed)
Positvie preg test at home but negative UPT here today. Ordered serum HCG, will notify of result  Adam PhenixJames G Arayah Krouse, MD 12/14/2013

## 2013-12-15 ENCOUNTER — Telehealth: Payer: Self-pay | Admitting: *Deleted

## 2013-12-15 DIAGNOSIS — Z8489 Family history of other specified conditions: Secondary | ICD-10-CM

## 2013-12-15 LAB — HCG, QUANTITATIVE, PREGNANCY: hCG, Beta Chain, Quant, S: 11.3 m[IU]/mL

## 2013-12-15 MED ORDER — PROGESTERONE 200 MG VA SUPP: 200.0000 mg | Freq: Every day | VAGINAL | Status: DC

## 2013-12-15 NOTE — Telephone Encounter (Signed)
Patient called nurse line requesting lab results that were drawn yesterday.  Contacted patient, gave results, patient states she was told to get progesterone vaginal supp as soon as a positive pregnancy test.  States Dr. Debroah LoopArnold instructed her to follow this protocol.  Informed patient that I will consult with a provider concerning her treatment plan.  Pt verbalizes understanding.

## 2013-12-15 NOTE — Telephone Encounter (Signed)
Consulted with Dr. Shawnie PonsPratt, new order given, called patient, and informed of medication being prescribed.  Pt will call for prenatal appt and lab/urine pregnancy test in a couple of weeks.  Pt verbalizes understanding.

## 2013-12-16 ENCOUNTER — Telehealth: Payer: Self-pay | Admitting: General Practice

## 2013-12-16 NOTE — Telephone Encounter (Signed)
Patient called and left message stating that she spoke to a nurse earlier today about her progesterone Rx and it still isn't at [facility], would like call back. [facility] and [facility] do not have [facility] form available but [facility] and called in Rx. Called patient and informed her of [facility] and available for pickup. Patient verbalized understanding and had no further questions

## 2013-12-17 ENCOUNTER — Telehealth: Payer: Self-pay

## 2013-12-17 NOTE — Telephone Encounter (Signed)
Called pt. And informed her that physician would like her to come back for a lab draw next week to ensure pregnancy hormone is increasing as the number is still very low. Informed pt. That if this lab result comes back much higher then we will go ahead and schedule her for a New OB appointment. Pt. States she canc ome in next Thursday December 24, 2013 at 1000. Informed pt. I would put her on the schedule for this blood draw. Pt. Verbalized understanding and gratitude and had no further questions or concerns.

## 2013-12-17 NOTE — Telephone Encounter (Signed)
Message copied by Louanna RawAMPBELL, Stelios Kirby M on Thu Dec 17, 2013  9:13 AM ------      Message from: Adam PhenixARNOLD, JAMES G      Created: Wed Dec 16, 2013 11:38 AM       Result very low but positive, repeat in 5-7 days ------

## 2013-12-24 ENCOUNTER — Other Ambulatory Visit: Payer: Medicaid Other

## 2013-12-24 ENCOUNTER — Telehealth: Payer: Self-pay | Admitting: General Practice

## 2013-12-24 DIAGNOSIS — E349 Endocrine disorder, unspecified: Secondary | ICD-10-CM

## 2013-12-24 NOTE — Telephone Encounter (Signed)
Patient called and left message stating she was here this morning and got her blood drawn and was told when she left that she didn't have a doctor's appt on the 15th and she is starting to get really frustrated and confused because she feels like she calls and is told one thing and given one appt time then calls another time and is told something entirely different about an appt. States she would like a call back to get everything sorted out because she's just very confused at this point. Called patient back stating I am returning her phone call. Patient states she came in on 3/23 and had a negative upt here but a positive one at home and had blood test here and her levels were 11. She called in another day because she remembered they wanted her to start progesterone the next time she got pregnant and she was put on progesterone suppositories and was told that, that may change to progesterone injections. Patient states that when she was here today for her repeat blood draw she was told she didn't have an appt on the 15th with a doctor or an appt at all like she was previously informed and she just isn't sure what is going on and isn't sure how she feels about this office is all this is already going on. Apologized to patient for the confusion and the back and forth. Told her that we were closed tomorrow for the holiday till Monday so she will not be able to get the blood test results before then unless her results get released by a doctor to her mychart account. Also, stated that if on Monday will see that her levels have gone up significantly like they are supposed to, I will speak to our office manager to see if we can work out something for her to come in on the 15th as originally planned.Patient verbalized understanding and was appreciative and states she is just concerned that it'll be May before she gets in for an appt and then by that things like the progesterone shots may not have been done on time. Told  patient that when she calls on Monday for her results to ask to speak to me so there is no more confusion and if her levels have gone up appropriately I will speak to my office manager at that time and we will try to work out an appt for her on that day as she was previously informed. Patient verbalized understanding, was satisfied and had no further questions

## 2013-12-25 LAB — HCG, QUANTITATIVE, PREGNANCY: HCG, BETA CHAIN, QUANT, S: 4090.3 m[IU]/mL

## 2013-12-28 ENCOUNTER — Other Ambulatory Visit: Payer: Self-pay | Admitting: General Practice

## 2013-12-28 DIAGNOSIS — Z3490 Encounter for supervision of normal pregnancy, unspecified, unspecified trimester: Secondary | ICD-10-CM

## 2013-12-28 NOTE — Progress Notes (Signed)
bhcg levels continuing rise appropriately; will schedule early ultrasound to confirm pregnancy and confirm progressing as normal; ultrasound scheduled for Friday 4/10 @ 1:45- if everything normal will schedule new OB visit 4/15. Called patient and confirmed appt time. Patient verbalized understanding and had no further questions

## 2014-01-01 ENCOUNTER — Ambulatory Visit (HOSPITAL_COMMUNITY)
Admission: RE | Admit: 2014-01-01 | Discharge: 2014-01-01 | Disposition: A | Discharge status: Home / Self Care | Payer: Medicaid Other | Source: Ambulatory Visit | Attending: Obstetrics & Gynecology | Admitting: Obstetrics & Gynecology

## 2014-01-01 DIAGNOSIS — Z3490 Encounter for supervision of normal pregnancy, unspecified, unspecified trimester: Secondary | ICD-10-CM

## 2014-01-01 DIAGNOSIS — Z3689 Encounter for other specified antenatal screening: Secondary | ICD-10-CM | POA: Insufficient documentation

## 2014-01-01 DIAGNOSIS — O2620 Pregnancy care for patient with recurrent pregnancy loss, unspecified trimester: Secondary | ICD-10-CM | POA: Insufficient documentation

## 2014-01-04 ENCOUNTER — Telehealth: Payer: Self-pay | Admitting: General Practice

## 2014-01-04 NOTE — Telephone Encounter (Signed)
Patient called back to front office and I informed her of normal ultrasound and asked if she could come in 4/15 @ 9 for a new OB appt to see a provider. Patient verbalized understanding and stated that she could. Patient had no further questions

## 2014-01-04 NOTE — Telephone Encounter (Signed)
Called patient to discuss her coming in for new OB appt on 4/15, no answer- left message stating we are trying to reach you about an appt, please give us a call back at the clinics

## 2014-01-06 ENCOUNTER — Encounter: Payer: Self-pay | Admitting: Family Medicine

## 2014-01-06 ENCOUNTER — Ambulatory Visit: Payer: Medicaid Other

## 2014-01-06 ENCOUNTER — Encounter: Payer: Self-pay | Admitting: Obstetrics and Gynecology

## 2014-01-06 ENCOUNTER — Ambulatory Visit (INDEPENDENT_AMBULATORY_CARE_PROVIDER_SITE_OTHER): Payer: Medicaid Other | Admitting: Obstetrics and Gynecology

## 2014-01-06 ENCOUNTER — Other Ambulatory Visit (HOSPITAL_COMMUNITY)
Admission: RE | Admit: 2014-01-06 | Discharge: 2014-01-06 | Disposition: A | Discharge status: Home / Self Care | Payer: Medicaid Other | Source: Ambulatory Visit | Attending: Obstetrics and Gynecology | Admitting: Obstetrics and Gynecology

## 2014-01-06 VITALS — BP 98/62 | Temp 97.7°F | Wt 120.8 lb

## 2014-01-06 DIAGNOSIS — Z3490 Encounter for supervision of normal pregnancy, unspecified, unspecified trimester: Secondary | ICD-10-CM

## 2014-01-06 DIAGNOSIS — O09219 Supervision of pregnancy with history of pre-term labor, unspecified trimester: Secondary | ICD-10-CM

## 2014-01-06 DIAGNOSIS — O0990 Supervision of high risk pregnancy, unspecified, unspecified trimester: Secondary | ICD-10-CM

## 2014-01-06 DIAGNOSIS — O2620 Pregnancy care for patient with recurrent pregnancy loss, unspecified trimester: Secondary | ICD-10-CM

## 2014-01-06 DIAGNOSIS — Z3480 Encounter for supervision of other normal pregnancy, unspecified trimester: Secondary | ICD-10-CM

## 2014-01-06 DIAGNOSIS — Z113 Encounter for screening for infections with a predominantly sexual mode of transmission: Secondary | ICD-10-CM | POA: Insufficient documentation

## 2014-01-06 DIAGNOSIS — K089 Disorder of teeth and supporting structures, unspecified: Secondary | ICD-10-CM

## 2014-01-06 DIAGNOSIS — Z1151 Encounter for screening for human papillomavirus (HPV): Secondary | ICD-10-CM | POA: Insufficient documentation

## 2014-01-06 DIAGNOSIS — K0889 Other specified disorders of teeth and supporting structures: Secondary | ICD-10-CM

## 2014-01-06 DIAGNOSIS — D6859 Other primary thrombophilia: Secondary | ICD-10-CM

## 2014-01-06 DIAGNOSIS — O09211 Supervision of pregnancy with history of pre-term labor, first trimester: Secondary | ICD-10-CM

## 2014-01-06 DIAGNOSIS — Z8489 Family history of other specified conditions: Secondary | ICD-10-CM

## 2014-01-06 DIAGNOSIS — Z01419 Encounter for gynecological examination (general) (routine) without abnormal findings: Secondary | ICD-10-CM | POA: Insufficient documentation

## 2014-01-06 DIAGNOSIS — D6851 Activated protein C resistance: Secondary | ICD-10-CM

## 2014-01-06 DIAGNOSIS — O09891 Supervision of other high risk pregnancies, first trimester: Secondary | ICD-10-CM | POA: Insufficient documentation

## 2014-01-06 DIAGNOSIS — O0991 Supervision of high risk pregnancy, unspecified, first trimester: Secondary | ICD-10-CM

## 2014-01-06 LAB — POCT URINALYSIS DIP (DEVICE)
Bilirubin Urine: NEGATIVE
GLUCOSE, UA: NEGATIVE mg/dL
KETONES UR: NEGATIVE mg/dL
LEUKOCYTES UA: NEGATIVE
Nitrite: NEGATIVE
Protein, ur: NEGATIVE mg/dL
SPECIFIC GRAVITY, URINE: 1.015 (ref 1.005–1.030)
Urobilinogen, UA: 0.2 mg/dL (ref 0.0–1.0)
pH: 7 (ref 5.0–8.0)

## 2014-01-06 MED ORDER — OXYCODONE-ACETAMINOPHEN 5-325 MG PO TABS: 1.0000 | ORAL_TABLET | ORAL | Status: DC | PRN

## 2014-01-06 MED ORDER — PROGESTERONE 200 MG VA SUPP: 200.0000 mg | Freq: Every day | VAGINAL | Status: DC

## 2014-01-06 NOTE — Progress Notes (Signed)
Pulse: 98 Patient reports nausea in the past few days.  New ob packet given

## 2014-01-06 NOTE — Progress Notes (Signed)
Subjective:    Barbara McmurrayBarbara L Tyler is a [id number] being seen today for her first obstetrical visit.  Her obstetrical history is significant for advanced maternal age and history of PTB. Patient does intend to breast feed. She is still breastfeeding her 36 year old occasionally. Pregnancy history fully reviewed.  Patient reports nausea, no cramping and severe dental pain. Dentist needs letter to pull wisdom tooth> given. Declines antiemetic. Her chart was reviewed by Dr. Debroah LoopArnold last month and she was started on Prometrium and BASA which she is taking. Ceasar Mons.  Filed Vitals:   01/06/14 0913  BP: 98/62  Temp: 97.7 F (36.5 C)  Weight: 54.795 kg (120 lb 12.8 oz)    HISTORY: OB History  Gravida Para Term Preterm AB SAB TAB Ectopic Multiple Living  7 2 1 1 4 4    2     # Outcome Date GA Lbr Len/2nd Weight Sex Delivery Anes PTL Lv  7 CUR           6 TRM 10/04/09 4989w0d  3.374 kg (7 lb 7 oz) F SVD  N Y  5 PRE 09/17/04 2732w0d  2.722 kg (6 lb) M SVD None Y Y     Comments: PPROM  4 SAB           3 SAB           2 SAB           1 SAB              Past Medical History  Diagnosis Date  . Preterm labor   . Infection     UTI  . Kidney stones   . Abnormal Pap smear     cryo, clean since  . Depression    Past Surgical History  Procedure Laterality Date  . No past surgeries    . Cryotherapy    . Dilation and evacuation N/A 02/27/2013    Procedure: DILATATION AND EVACUATION;  Surgeon: Reva Boresanya S Pratt, MD;  Location: [facility];  Service: Gynecology;  Laterality: N/A;   Family History  Problem Relation Age of Onset  . Cancer Father   . Cancer Maternal Grandmother     breast- great grandma     Exam    Uterus:     Pelvic Exam:    Perineum: Normal Perineum   Vulva: normal, Bartholin's, Urethra, Skene's normal   Vagina:  normal mucosa, normal discharge       Cervix: multiparous appearance and no bleeding following Pap   Adnexa: not evaluated   Bony Pelvis: average  System: Breast:  normal  appearance, no masses or tenderness   Skin: normal coloration and turgor, no rashes    Neurologic: oriented, normal, grossly non-focal   Extremities: normal strength, tone, and muscle mass   HEENT PERRLA and extra ocular movement intact   Mouth/Teeth mucous membranes moist, pharynx normal without lesions. Right upper molar region tender   Neck supple and no masses   Cardiovascular: regular rate and rhythm, no murmurs or gallops   Respiratory:  appears well, vitals normal, no respiratory distress, acyanotic, normal RR, ear and throat exam is normal, neck free of mass or lymphadenopathy, chest clear, no wheezing, crepitations, rhonchi, normal symmetric air entry   Abdomen: soft, non-tender; bowel sounds normal; no masses,  no organomegaly   Urinary: urethral meatus normal      Assessment:    Pregnancy: [id number] Patient Active Problem List   Diagnosis Date Noted  . Supervision  of high risk pregnancy in first trimester 01/06/2014  . History of preterm delivery, currently pregnant in first trimester 01/06/2014  . Pain, dental 01/06/2014  . Subclinical hypothyroidism 04/01/2013  . Heterozygous factor V Leiden mutation 04/01/2013  . History of recurrent abortion, not currently pregnant 03/23/2013  . Depression 03/23/2013        Plan:     Initial labs drawn. Prenatal vitamins. Problem list reviewed and updated. Genetic Screening discussed Quad Screen: declined.  Ultrasound discussed; fetal survey: requested.  Follow up in 4 weeks. 50% of 30 min visit spent on counseling and coordination of care.  Letter for dentist and Rx Percocet #10 to take if Tylenol ineffective.  Discussed 17-P   Barbara Tyler 01/06/2014

## 2014-01-06 NOTE — Patient Instructions (Signed)
Pregnancy - First Trimester  During sexual intercourse, millions of sperm go into the vagina. Only 1 sperm will penetrate and fertilize the female egg while it is in the Fallopian tube. One week later, the fertilized egg implants into the wall of the uterus. An embryo begins to develop into a baby. At 6 to 8 weeks, the eyes and face are formed and the heartbeat can be seen on ultrasound. At the end of 12 weeks (first trimester), all the baby's organs are formed. Now that you are pregnant, you will want to do everything you can to have a healthy baby. Two of the most important things are to get good prenatal care and follow your caregiver's instructions. Prenatal care is all the medical care you receive before the baby's birth. It is given to prevent, find, and treat problems during the pregnancy and childbirth.  PRENATAL EXAMS  · During prenatal visits, your weight, blood pressure, and urine are checked. This is done to make sure you are healthy and progressing normally during the pregnancy.  · A pregnant woman should gain 25 to 35 pounds during the pregnancy. However, if you are overweight or underweight, your caregiver will advise you regarding your weight.  · Your caregiver will ask and answer questions for you.  · Blood work, cervical cultures, other necessary tests, and a Pap test are done during your prenatal exams. These tests are done to check on your health and the probable health of your baby. Tests are strongly recommended and done for HIV with your permission. This is the virus that causes AIDS. These tests are done because medicines can be given to help prevent your baby from being born with this infection should you have been infected without knowing it. Blood work is also used to find out your blood type, previous infections, and follow your blood levels (hemoglobin).  · Low hemoglobin (anemia) is common during pregnancy. Iron and vitamins are given to help prevent this. Later in the pregnancy, blood  tests for diabetes will be done along with any other tests if any problems develop.  · You may need other tests to make sure you and the baby are doing well.  CHANGES DURING THE FIRST TRIMESTER   Your body goes through many changes during pregnancy. They vary from person to person. Talk to your caregiver about changes you notice and are concerned about. Changes can include:  · Your menstrual period stops.  · The egg and sperm carry the genes that determine what you look like. Genes from you and your partner are forming a baby. The female genes determine whether the baby is a boy or a girl.  · Your body increases in girth and you may feel bloated.  · Feeling sick to your stomach (nauseous) and throwing up (vomiting). If the vomiting is uncontrollable, call your caregiver.  · Your breasts will begin to enlarge and become tender.  · Your nipples may stick out more and become darker.  · The need to urinate more. Painful urination may mean you have a bladder infection.  · Tiring easily.  · Loss of appetite.  · Cravings for certain kinds of food.  · At first, you may gain or lose a couple of pounds.  · You may have changes in your emotions from day to day (excited to be pregnant or concerned something may go wrong with the pregnancy and baby).  · You may have more vivid and strange dreams.  HOME CARE INSTRUCTIONS   ·   It is very important to avoid all smoking, alcohol and non-prescribed drugs during your pregnancy. These affect the formation and growth of the baby. Avoid chemicals while pregnant to ensure the delivery of a healthy infant.  · Start your prenatal visits by the 12th week of pregnancy. They are usually scheduled monthly at first, then more often in the last 2 months before delivery. Keep your caregiver's appointments. Follow your caregiver's instructions regarding medicine use, blood and lab tests, exercise, and diet.  · During pregnancy, you are providing food for you and your baby. Eat regular, well-balanced  meals. Choose foods such as meat, fish, milk and other low fat dairy products, vegetables, fruits, and whole-grain breads and cereals. Your caregiver will tell you of the ideal weight gain.  · You can help morning sickness by keeping soda crackers at the bedside. Eat a couple before arising in the morning. You may want to use the crackers without salt on them.  · Eating 4 to 5 small meals rather than 3 large meals a day also may help the nausea and vomiting.  · Drinking liquids between meals instead of during meals also seems to help nausea and vomiting.  · A physical sexual relationship may be continued throughout pregnancy if there are no other problems. Problems may be early (premature) leaking of amniotic fluid from the membranes, vaginal bleeding, or belly (abdominal) pain.  · Exercise regularly if there are no restrictions. Check with your caregiver or physical therapist if you are unsure of the safety of some of your exercises. Greater weight gain will occur in the last 2 trimesters of pregnancy. Exercising will help:  · Control your weight.  · Keep you in shape.  · Prepare you for labor and delivery.  · Help you lose your pregnancy weight after you deliver your baby.  · Wear a good support or jogging bra for breast tenderness during pregnancy. This may help if worn during sleep too.  · Ask when prenatal classes are available. Begin classes when they are offered.  · Do not use hot tubs, steam rooms, or saunas.  · Wear your seat belt when driving. This protects you and your baby if you are in an accident.  · Avoid raw meat, uncooked cheese, cat litter boxes, and soil used by cats throughout the pregnancy. These carry germs that can cause birth defects in the baby.  · The first trimester is a good time to visit your dentist for your dental health. Getting your teeth cleaned is okay. Use a softer toothbrush and brush gently during pregnancy.  · Ask for help if you have financial, counseling, or nutritional needs  during pregnancy. Your caregiver will be able to offer counseling for these needs as well as refer you for other special needs.  · Do not take any medicines or herbs unless told by your caregiver.  · Inform your caregiver if there is any mental or physical domestic violence.  · Make a list of emergency phone numbers of family, friends, hospital, and police and fire departments.  · Write down your questions. Take them to your prenatal visit.  · Do not douche.  · Do not cross your legs.  · If you have to stand for long periods of time, rotate you feet or take small steps in a circle.  · You may have more vaginal secretions that may require a sanitary pad. Do not use tampons or scented sanitary pads.  MEDICINES AND DRUG USE IN PREGNANCY  ·   Take prenatal vitamins as directed. The vitamin should contain 1 milligram of folic acid. Keep all vitamins out of reach of children. Only a couple vitamins or tablets containing iron may be fatal to a baby or young child when ingested.  · Avoid use of all medicines, including herbs, over-the-counter medicines, not prescribed or suggested by your caregiver. Only take over-the-counter or prescription medicines for pain, discomfort, or fever as directed by your caregiver. Do not use aspirin, ibuprofen, or naproxen unless directed by your caregiver.  · Let your caregiver also know about herbs you may be using.  · Alcohol is related to a number of birth defects. This includes fetal alcohol syndrome. All alcohol, in any form, should be avoided completely. Smoking will cause low birth rate and premature babies.  · Street or illegal drugs are very harmful to the baby. They are absolutely forbidden. A baby born to an addicted mother will be addicted at birth. The baby will go through the same withdrawal an adult does.  · Let your caregiver know about any medicines that you have to take and for what reason you take them.  SEEK MEDICAL CARE IF:   You have any concerns or worries during your  pregnancy. It is better to call with your questions if you feel they cannot wait, rather than worry about them.  SEEK IMMEDIATE MEDICAL CARE IF:   · An unexplained oral temperature above 102° F (38.9° C) develops, or as your caregiver suggests.  · You have leaking of fluid from the vagina (birth canal). If leaking membranes are suspected, take your temperature and inform your caregiver of this when you call.  · There is vaginal spotting or bleeding. Notify your caregiver of the amount and how many pads are used.  · You develop a bad smelling vaginal discharge with a change in the color.  · You continue to feel sick to your stomach (nauseated) and have no relief from remedies suggested. You vomit blood or coffee ground-like materials.  · You lose more than 2 pounds of weight in 1 week.  · You gain more than 2 pounds of weight in 1 week and you notice swelling of your face, hands, feet, or legs.  · You gain 5 pounds or more in 1 week (even if you do not have swelling of your hands, face, legs, or feet).  · You get exposed to German measles and have never had them.  · You are exposed to fifth disease or chickenpox.  · You develop belly (abdominal) pain. Round ligament discomfort is a common non-cancerous (benign) cause of abdominal pain in pregnancy. Your caregiver still must evaluate this.  · You develop headache, fever, diarrhea, pain with urination, or shortness of breath.  · You fall or are in a car accident or have any kind of trauma.  · There is mental or physical violence in your home.  Document Released: 09/04/2001 Document Revised: 06/04/2012 Document Reviewed: 03/08/2009  [employer]® Patient Information ©2014 [employer].

## 2014-01-07 LAB — OBSTETRIC PANEL
Antibody Screen: NEGATIVE
BASOS ABS: 0 10*3/uL (ref 0.0–0.1)
Basophils Relative: 0 % (ref 0–1)
EOS PCT: 1 % (ref 0–5)
Eosinophils Absolute: 0.1 10*3/uL (ref 0.0–0.7)
HEMATOCRIT: 35.9 % — AB (ref 36.0–46.0)
Hemoglobin: 12.2 g/dL (ref 12.0–15.0)
Hepatitis B Surface Ag: NEGATIVE
LYMPHS ABS: 1.8 10*3/uL (ref 0.7–4.0)
LYMPHS PCT: 20 % (ref 12–46)
MCH: 30.6 pg (ref 26.0–34.0)
MCHC: 34 g/dL (ref 30.0–36.0)
MCV: 90 fL (ref 78.0–100.0)
MONO ABS: 0.5 10*3/uL (ref 0.1–1.0)
Monocytes Relative: 6 % (ref 3–12)
NEUTROS ABS: 6.6 10*3/uL (ref 1.7–7.7)
Neutrophils Relative %: 73 % (ref 43–77)
PLATELETS: 356 10*3/uL (ref 150–400)
RBC: 3.99 MIL/uL (ref 3.87–5.11)
RDW: 12.9 % (ref 11.5–15.5)
RUBELLA: 2.24 {index} — AB (ref <0.90)
Rh Type: POSITIVE
WBC: 9.1 10*3/uL (ref 4.0–10.5)

## 2014-01-07 LAB — HIV ANTIBODY (ROUTINE TESTING W REFLEX): HIV: NONREACTIVE

## 2014-01-08 LAB — PRESCRIPTION MONITORING PROFILE (19 PANEL)
AMPHETAMINE/METH: NEGATIVE ng/mL
BARBITURATE SCREEN, URINE: NEGATIVE ng/mL
BENZODIAZEPINE SCREEN, URINE: NEGATIVE ng/mL
Buprenorphine, Urine: NEGATIVE ng/mL
COCAINE METABOLITES: NEGATIVE ng/mL
CREATININE, URINE: 133.69 mg/dL (ref >20.0)
Cannabinoid Scrn, Ur: NEGATIVE ng/mL
Carisoprodol, Urine: NEGATIVE ng/mL
Fentanyl, Ur: NEGATIVE ng/mL
MDMA URINE: NEGATIVE ng/mL
Meperidine, Ur: NEGATIVE ng/mL
Methadone Screen, Urine: NEGATIVE ng/mL
Methaqualone: NEGATIVE ng/mL
Nitrites, Initial: NEGATIVE ug/mL
OPIATE SCREEN, URINE: NEGATIVE ng/mL
OXYCODONE SCRN UR: NEGATIVE ng/mL
PH URINE, INITIAL: 6.5 pH (ref 4.5–8.9)
PROPOXYPHENE: NEGATIVE ng/mL
Phencyclidine, Ur: NEGATIVE ng/mL
TAPENTADOLUR: NEGATIVE ng/mL
TRAMADOL UR: NEGATIVE ng/mL
ZOLPIDEM, URINE: NEGATIVE ng/mL

## 2014-01-08 LAB — CULTURE, OB URINE
Colony Count: NO GROWTH
Organism ID, Bacteria: NO GROWTH

## 2014-01-25 ENCOUNTER — Telehealth: Payer: Self-pay

## 2014-01-25 NOTE — Telephone Encounter (Signed)
Pt called and stated that she is taking progesterone suppositories and wants to know if there is anything that can counteract the fact that she is always vomiting and having abdominal cramping "it seems as though it's making me more sicker"

## 2014-01-26 NOTE — Telephone Encounter (Signed)
Called patient back and she stated the progesterone has been tearing her stomach up for the past few weeks and it only seems to be getting worse. Patient states that it causes abdominal pain and bloating almost like gas pain and that she really isn't able to tolerate many foods without causing those sensations. Recommended to patient that she stop the PNV for a few days and to try OTC simethicone or gas-x. Patient verbalized understanding and stated she would see us on the 13th. Patient had no further questions

## 2014-02-03 ENCOUNTER — Ambulatory Visit (INDEPENDENT_AMBULATORY_CARE_PROVIDER_SITE_OTHER): Payer: Medicaid Other | Admitting: Family

## 2014-02-03 ENCOUNTER — Telehealth: Payer: Self-pay | Admitting: *Deleted

## 2014-02-03 VITALS — BP 100/63 | HR 73 | Temp 97.8°F | Wt 119.5 lb

## 2014-02-03 DIAGNOSIS — D6851 Activated protein C resistance: Secondary | ICD-10-CM

## 2014-02-03 DIAGNOSIS — D6859 Other primary thrombophilia: Secondary | ICD-10-CM

## 2014-02-03 DIAGNOSIS — O09891 Supervision of other high risk pregnancies, first trimester: Secondary | ICD-10-CM

## 2014-02-03 DIAGNOSIS — Z8489 Family history of other specified conditions: Secondary | ICD-10-CM

## 2014-02-03 DIAGNOSIS — O2620 Pregnancy care for patient with recurrent pregnancy loss, unspecified trimester: Secondary | ICD-10-CM

## 2014-02-03 DIAGNOSIS — O09219 Supervision of pregnancy with history of pre-term labor, unspecified trimester: Secondary | ICD-10-CM

## 2014-02-03 DIAGNOSIS — K089 Disorder of teeth and supporting structures, unspecified: Secondary | ICD-10-CM

## 2014-02-03 DIAGNOSIS — O0991 Supervision of high risk pregnancy, unspecified, first trimester: Secondary | ICD-10-CM

## 2014-02-03 DIAGNOSIS — Z3480 Encounter for supervision of other normal pregnancy, unspecified trimester: Secondary | ICD-10-CM

## 2014-02-03 DIAGNOSIS — O09211 Supervision of pregnancy with history of pre-term labor, first trimester: Secondary | ICD-10-CM

## 2014-02-03 DIAGNOSIS — K0889 Other specified disorders of teeth and supporting structures: Secondary | ICD-10-CM

## 2014-02-03 DIAGNOSIS — Z3490 Encounter for supervision of normal pregnancy, unspecified, unspecified trimester: Secondary | ICD-10-CM

## 2014-02-03 LAB — POCT URINALYSIS DIP (DEVICE)
BILIRUBIN URINE: NEGATIVE
GLUCOSE, UA: NEGATIVE mg/dL
HGB URINE DIPSTICK: NEGATIVE
KETONES UR: NEGATIVE mg/dL
Nitrite: NEGATIVE
Protein, ur: NEGATIVE mg/dL
SPECIFIC GRAVITY, URINE: 1.015 (ref 1.005–1.030)
UROBILINOGEN UA: 0.2 mg/dL (ref 0.0–1.0)
pH: 7 (ref 5.0–8.0)

## 2014-02-03 LAB — US OB COMP LESS 14 WKS

## 2014-02-03 MED ORDER — PROGESTERONE 200 MG VA SUPP: 200.0000 mg | Freq: Every day | VAGINAL | Status: DC

## 2014-02-03 NOTE — Progress Notes (Signed)
Bedside US for FHR = 166 bpm.  Sid FalconWalidah Muhammad, CNM notified

## 2014-02-03 NOTE — Progress Notes (Signed)
No complaints today.

## 2014-02-03 NOTE — Progress Notes (Signed)
[facility] first screen and NIPS > refer to MFM.  Complete application for 17p today.  Pt currently breastfeeding, advised against it since history or preterm labor.  Obtain 17p and listeria lab (per pt request).  Transfer to high risk clinic.

## 2014-02-03 NOTE — Telephone Encounter (Signed)
Barbara MccreedyBarbara called and left a message that she was just seen this am and that they went over a lot of information and she couldn't remember if they discussed refilling her progesterone, but she does need it refilled. States she doesn't want to bother pharmacy or our clinic- but please let her know.

## 2014-02-03 NOTE — Telephone Encounter (Signed)
Per chart review prescription was ep[facility]cribed to [facility]. Called Britta MccreedyBarbara and left message with husband that a prescription was sent to her pharmacy.

## 2014-02-18 ENCOUNTER — Ambulatory Visit (INDEPENDENT_AMBULATORY_CARE_PROVIDER_SITE_OTHER): Payer: Medicaid Other | Admitting: Obstetrics & Gynecology

## 2014-02-18 ENCOUNTER — Ambulatory Visit (HOSPITAL_COMMUNITY)
Admission: RE | Admit: 2014-02-18 | Discharge: 2014-02-18 | Disposition: A | Discharge status: Home / Self Care | Payer: Medicaid Other | Source: Ambulatory Visit | Attending: Family | Admitting: Family

## 2014-02-18 ENCOUNTER — Other Ambulatory Visit: Payer: Self-pay | Admitting: Obstetrics & Gynecology

## 2014-02-18 ENCOUNTER — Encounter (HOSPITAL_COMMUNITY): Payer: Self-pay

## 2014-02-18 ENCOUNTER — Other Ambulatory Visit: Payer: Self-pay

## 2014-02-18 VITALS — BP 96/60 | HR 77 | Temp 97.3°F | Wt 123.9 lb

## 2014-02-18 DIAGNOSIS — O09211 Supervision of pregnancy with history of pre-term labor, first trimester: Secondary | ICD-10-CM

## 2014-02-18 DIAGNOSIS — O09219 Supervision of pregnancy with history of pre-term labor, unspecified trimester: Secondary | ICD-10-CM

## 2014-02-18 DIAGNOSIS — O09891 Supervision of other high risk pregnancies, first trimester: Secondary | ICD-10-CM

## 2014-02-18 DIAGNOSIS — O09519 Supervision of elderly primigravida, unspecified trimester: Secondary | ICD-10-CM | POA: Insufficient documentation

## 2014-02-18 DIAGNOSIS — D6859 Other primary thrombophilia: Secondary | ICD-10-CM | POA: Insufficient documentation

## 2014-02-18 DIAGNOSIS — A059 Bacterial foodborne intoxication, unspecified: Secondary | ICD-10-CM

## 2014-02-18 DIAGNOSIS — A058 Other specified bacterial foodborne intoxications: Secondary | ICD-10-CM

## 2014-02-18 DIAGNOSIS — O0991 Supervision of high risk pregnancy, unspecified, first trimester: Secondary | ICD-10-CM

## 2014-02-18 DIAGNOSIS — IMO0002 Reserved for concepts with insufficient information to code with codable children: Secondary | ICD-10-CM | POA: Insufficient documentation

## 2014-02-18 DIAGNOSIS — O3680X0 Pregnancy with inconclusive fetal viability, not applicable or unspecified: Secondary | ICD-10-CM

## 2014-02-18 LAB — US OB COMP LESS 14 WKS

## 2014-02-18 LAB — POCT URINALYSIS DIP (DEVICE)
BILIRUBIN URINE: NEGATIVE
GLUCOSE, UA: NEGATIVE mg/dL
HGB URINE DIPSTICK: NEGATIVE
KETONES UR: NEGATIVE mg/dL
NITRITE: NEGATIVE
PH: 6 (ref 5.0–8.0)
Protein, ur: NEGATIVE mg/dL
Specific Gravity, Urine: 1.02 (ref 1.005–1.030)
Urobilinogen, UA: 0.2 mg/dL (ref 0.0–1.0)

## 2014-02-18 NOTE — Progress Notes (Signed)
MFM appt for NIPS.  No bleeding.  Pt on prog and baby asa. US showed +FH.  TSH drawn today. Pt was to have Listeria culture drawn when having symptoms.  PT still would like vulture drawn--explained infection would be highly unlikely if not having symptoms.  Pt has had multiple losses so would rest better if culture was drawn.

## 2014-02-18 NOTE — Progress Notes (Signed)
Genetic Counseling  High-Risk Gestation Note  Appointment Date:  02/18/2014 Referred By: Melissa NoonMuhammad, Walidah N, CNM Date of Birth:  [birth year]   Pregnancy History: [id number] Estimated Date of Delivery: 08/25/14 Estimated Gestational Age: 6439w1d Attending: Particia NearingMartha Decker, MD   Ms. Barbara McmurrayBarbara L Pindell was seen for genetic counseling because of a maternal age of 38 y.o..     She was counseled regarding maternal age and the association with risk for chromosome conditions due to nondisjunction with aging of the ova.   We reviewed chromosomes, nondisjunction, and the associated 1 in 114 risk for fetal aneuploidy related to a maternal age of 38 y.o. at 5539w1d gestation.  She was counseled that the risk for aneuploidy decreases as gestational age increases, accounting for those pregnancies which spontaneously abort.  We specifically discussed Down syndrome (trisomy 5721), trisomies 4813 and 4918, and sex chromosome aneuploidies (47,XXX and 47,XXY) including the common features and prognoses of each.   We reviewed available screening options including First Screen, Quad screen, noninvasive prenatal screening (NIPS)/cell free DNA (cfDNA) testing, and detailed ultrasound.  She was counseled that screening tests are used to modify a patient's a priori risk for aneuploidy, typically based on age. This estimate provides a pregnancy specific risk assessment. We reviewed the benefits and limitations of each option. Specifically, we discussed the conditions for which each test screens, the detection rates, and false positive rates of each. She was also counseled regarding diagnostic testing via CVS and amniocentesis. We reviewed the approximate 1 in 100 risk for complications for CVS and the approximate 1 in 300-500 risk for complications for amniocentesis, including spontaneous pregnancy loss. After consideration of all the options, she elected to proceed with NIPS (Panorama).  Those results will be available in ~8-10 days.  She  stated that she is not interested in amniocentesis or CVS in the pregnancy at this time, given the associated risks for complications.   She also expressed interest in pursuing a nuchal translucency ultrasound, which was performed today.  The report will be documented separately.  The patient would like to return for a detailed ultrasound at ~18+ weeks gestation.  This appointment was scheduled for 03/25/14. She understands that screening tests cannot rule out all birth defects or genetic syndromes. The patient was advised of this limitation and states she still does not want additional testing at this time.   [name] was provided with written information regarding cystic fibrosis (CF) including the carrier frequency and incidence in the Caucasian population, the availability of carrier testing and prenatal diagnosis if indicated.  In addition, we discussed that CF is routinely screened for as part of the [place] newborn screening panel.  She declined testing today.   Both family histories were reviewed and found to be contributory for a history of three first trimester miscarriages for the patient: two with her first partner and one with her current partner. 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 She reported no history of blood clots. No additional relatives were reported to have had testing for Factor V Leiden mutation, and there was no known family history blood clots. She reported that her mother had a history of three or four miscarriages, and the exact cause is not known, though there was a question of whether or not she was predisposed to blood clots. Factor V is a protein in the body which helps to promote the conversion of prothrombin to thrombin.  Activated  factor V is typically inactivated by a complex that forms between activated protein C and S.  It was discovered that some individuals have an inherited condition that  prevents the protein C/protein S complex from effectively inactivating activated factor V (termed activated protein C resistance).  The majority (>90%) of individuals with this phenotype were found to have a single amino acid substitution (G1691A) in the factor V gene.  This mutation has been termed factor V Leiden (FVL).  In these individuals, activated factor V is not degraded as efficiently, resulting in increased coagulability.  FVL is the most common inherited thrombophilia, occurring in approximately 5% of [place] Caucasians. 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 the normal factor V gene.  We discussed that testing for FVL is typically not offered to asymptomatic minors but would be available to her children later in life, if they desire this testing.   Additionally, the father of the pregnancy has a nephew (his brother's second son) with congenital hearing loss, which is progressive. He is currently 36 year old and otherwise healthy. An underlying cause is not known for his hearing loss. Hearing loss can have many causes including genetic factors, environmental factors or a combination of both.  Sometimes hearing loss can occur as one feature of an underlying genetic condition or may be caused by a single nonworking gene. Additional information regarding a cause for their hearing loss is needed in order to most accurately assess recurrence risk for relatives. We reviewed that hearing is assessed as part of newborn screening in [place].  The father of the pregnancy also reportedly has a female paternal first cousin with Down syndrome (unspecified type). This individual is currently 36 years old and reportedly is doing well. His mother was reportedly age early to mid-3's when he was born. We discussed that 95% of cases of Down syndrome are not inherited and are the result of non-disjunction.  Three to 4% of cases of Down  syndrome are the result of a translocation involving chromosome #21, which can be sporadic or inherited from a translocation carrier.  We discussed the option of chromosome analysis to determine if an individual is a carrier of a balanced translocation involving chromosome #21.  If an individual carries a balanced translocation involving chromosome #21, then the chance to have a baby with Down syndrome would be greater than the maternal age-related risk.  We discussed that given the reported family history, this relative's Down syndrome was most likely sporadic. However, additional information regarding his or the father of the pregnancy's karyotype may alter recurrence risk assessment.   Ms. Inch also reported a maternal first cousin who had twins, one of which was born with her intestines outside of her body. The patient reported that the intestines were not contained within the umbilical cord, as best she knew. This individual is otherwise healthy. 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 Gastroschisis occurs in approximately 1 in 5,000-10,000 live births, more often in babies born to younger mothers. Smoking during pregnancy has also been associated with increased risk for gastroschisis.  We reviewed that gastroschisis is considered an abdominal wall defect which is characterized by an opening to the right of the umbilicus through which the intestines and other visceral organs protrude.  We discussed that gastroschisis is typically not associated with chromosomal anomalies or other structural malformations with the exception  of intestinal atresia.  The majority of cases of gastroschisis are thought to be sporadic and multifactorial in etiology, with a low risk of recurrence.  However, there have been reports of both autosomal recessive and dominant inheritance. We discussed that the reported family history of a fifth degree relative to the pregnancy with likely gastroschisis  would not be expected to increase recurrence risk for gastroschisis in the current pregnancy.  Without further information regarding the provided family history, an accurate genetic risk cannot be calculated. Further genetic counseling is warranted if more information is obtained.  Ms. Tabler denied exposure to environmental toxins or chemical agents. She denied the use of alcohol, tobacco or street drugs. She denied significant viral illnesses during the course of her pregnancy. Her medical and surgical histories were contributory for Factor V Leiden heterozygosity. She reported that she is currently taking 81 mg aspirin daily.    I counseled [name] regarding the above risks and available options.  The approximate face-to-face time with the genetic counselor was 40 minutes.  Helyn App Jeni Salles, MS,  Certified Genetic Counselor 02/18/2014

## 2014-02-19 LAB — TSH: TSH: 5.48 u[IU]/mL — ABNORMAL HIGH (ref 0.350–4.500)

## 2014-02-19 NOTE — Progress Notes (Addendum)
Bedside US for FHR = 154 bpm.  FM present. Pt felt reassured of fetal well-being.

## 2014-02-22 ENCOUNTER — Other Ambulatory Visit (HOSPITAL_COMMUNITY): Payer: Self-pay | Admitting: Obstetrics & Gynecology

## 2014-02-22 DIAGNOSIS — R7989 Other specified abnormal findings of blood chemistry: Secondary | ICD-10-CM

## 2014-02-22 NOTE — Progress Notes (Signed)
T3 T4 added on to patient's lab draw. Lab tech [place] notified, [facility] called and labs taken care of.

## 2014-02-23 LAB — T3, FREE: T3 FREE: 2.7 pg/mL (ref 2.3–4.2)

## 2014-02-23 LAB — T4, FREE: Free T4: 0.98 ng/dL (ref 0.80–1.80)

## 2014-02-24 ENCOUNTER — Encounter: Payer: Self-pay | Admitting: Obstetrics & Gynecology

## 2014-02-24 ENCOUNTER — Other Ambulatory Visit: Payer: Self-pay | Admitting: Obstetrics & Gynecology

## 2014-02-24 ENCOUNTER — Telehealth: Payer: Self-pay

## 2014-02-24 DIAGNOSIS — O09529 Supervision of elderly multigravida, unspecified trimester: Secondary | ICD-10-CM

## 2014-02-24 DIAGNOSIS — E0590 Thyrotoxicosis, unspecified without thyrotoxic crisis or storm: Secondary | ICD-10-CM

## 2014-02-24 DIAGNOSIS — Z0489 Encounter for examination and observation for other specified reasons: Secondary | ICD-10-CM

## 2014-02-24 DIAGNOSIS — IMO0002 Reserved for concepts with insufficient information to code with codable children: Secondary | ICD-10-CM

## 2014-02-24 LAB — CULTURE, BLOOD (SINGLE): ORGANISM ID, BACTERIA: NO GROWTH

## 2014-02-24 NOTE — Telephone Encounter (Signed)
[facility] [phone]. Administrator stated to fax over patient information/demgraphics, insurance information and office notes to [phone]. Dr. Leslie Dales will review and the office will call patient with appointment.

## 2014-02-24 NOTE — Telephone Encounter (Signed)
Called patient to inform her that referral has been faxed and she should receive call from [facility] with appointment within the next week or so but to call clinic if she does not hear from them so that we may follow up.

## 2014-02-24 NOTE — Telephone Encounter (Signed)
Message copied by Louanna Raw on Wed Feb 24, 2014 11:57 AM ------      Message from: Lesly Dukes      Created: Wed Feb 24, 2014 11:25 AM       Elevated TSH, nml FT3 and FT4.  History of subclinical hypothyroidism and 3SABs.  Will refer to endocrine for recommendations about treating with low dose synthroid.  Pt caled by Dr. Penne Lash and knows RN will call with referral appt. ------

## 2014-02-26 ENCOUNTER — Telehealth (HOSPITAL_COMMUNITY): Payer: Self-pay | Admitting: MS"

## 2014-02-26 NOTE — Telephone Encounter (Signed)
Left message for patient to return call.   Helyn App Ech Fontella Shan 02/26/2014 2:01 PM

## 2014-03-01 ENCOUNTER — Telehealth: Payer: Self-pay

## 2014-03-01 NOTE — Telephone Encounter (Signed)
Called Barbara Tyler to discuss her cell free fetal DNA test results.  Barbara Tyler had Panorama testing through [facility].  Testing was offered because of maternal age.   The patient was identified by name and DOB.  We reviewed that these are within normal limits, showing a less than 1 in 10,000 risk for trisomies 21, 18 and 13, and monosomy X (Turner syndrome).  In addition, the risk for triploidy/vanishing twin and sex chromosome trisomies (47,XXX and 47,XXY) was also low risk.  This testing identifies > 99% of pregnancies with trisomy 10, trisomy 50, sex chromosome trisomies (47,XXX and 47,XXY), and triploidy. The detection rate for trisomy 18 is 96%.  The detection rate for monosomy X is ~92%.  The false positive rate is <0.1% for all conditions. Barbara Tyler declined testing for fetal sex.  She understands that this testing does not identify all genetic conditions.  All questions were answered to her satisfaction, she was encouraged to call with additional questions or concerns.  Helyn App Jeni Salles, MS Certified Genetic Counselor 03/01/2014 11:40 AM

## 2014-03-01 NOTE — Telephone Encounter (Signed)
Patient called stating she has not heard from [facility] about an appointment. Would like a call back.  [facility] to check on referral-- was told woman who works on referrals is not in the office-- was tranferred to her voicemail-- left message with patient information and requested call back so that we may know where she is on the referral process. Called patient and informed her we are waiting to hear back, however, gave her [facility] number so that she may call as well.

## 2014-03-01 NOTE — Telephone Encounter (Signed)
Attempted to call patient regarding normal cell free DNA testing (Panorama). Left voicemail message for patient to return call.   Barbara Tyler 03/01/2014 11:05 AM

## 2014-03-03 ENCOUNTER — Telehealth: Payer: Self-pay | Admitting: *Deleted

## 2014-03-03 NOTE — Telephone Encounter (Signed)
Phoned [facility] and spoke with Wells Guiles.  She verified the receipt of records and says the provider has them to review.  Once he has done so she will contact the patient with an appointment.  I asked she call our office at [phone] and speak with any of the nurses so they can document the patients appointment in the patient record.  I then phoned the patient.  I explained the status of the referral as documented above.  Explained she should expect a call from [facility] tomorrow 03/04/14 or Friday 03/05/14 regarding an appointment.  Patient states understanding.

## 2014-03-03 NOTE — Telephone Encounter (Signed)
Received a call yesterday from the Office of Patient Experience that the patient had called and was concerned that her referral to Endocrinology was not going as she had expected.  Patient hasn't received appointment yet.  I spoke with Wells Guiles at [facility] [phone] this morning.  Lacie states that she has spoken with the patient several times.  Lacie states she is still waiting on records from our office that must be reviewed by their provider before an appointment can be made.  Explained we have documented that we did send the records however, I would send them again.  Lacie stated as soon as she receives the records she would put a rush on it with the provider to get them reviewed and would call me with the appointment.  Records were faxed to [phone].  Followed up the fax with a call to the office to confirm receipt.  Couldn't speak with Lacie as she was with patients.  Left a voicemail for her to let her know the records have been faxed and asked her to call me with the appointment.    Phoned patient to make her aware of the status of the appointment.  Explained that I hope to hear from the office today and will call her the moment I hear from them.  Patient stated understanding.

## 2014-03-05 ENCOUNTER — Telehealth: Payer: Self-pay

## 2014-03-05 NOTE — Telephone Encounter (Signed)
[facility] to [place] in charge of referrals. She informed me she has spoken to patient a few days ago but had attempted to call her several times since then to set up an appointment. The provider has a slot available next week and patient will be scheduled for that appointment if it works with her schedule. Laci states she will continue to call patient.

## 2014-03-18 ENCOUNTER — Ambulatory Visit (INDEPENDENT_AMBULATORY_CARE_PROVIDER_SITE_OTHER): Payer: Medicaid Other | Admitting: Obstetrics & Gynecology

## 2014-03-18 VITALS — BP 102/68 | HR 83 | Temp 97.7°F | Wt 128.0 lb

## 2014-03-18 DIAGNOSIS — O0990 Supervision of high risk pregnancy, unspecified, unspecified trimester: Secondary | ICD-10-CM

## 2014-03-18 DIAGNOSIS — O09211 Supervision of pregnancy with history of pre-term labor, first trimester: Principal | ICD-10-CM

## 2014-03-18 DIAGNOSIS — O09219 Supervision of pregnancy with history of pre-term labor, unspecified trimester: Secondary | ICD-10-CM

## 2014-03-18 DIAGNOSIS — O09891 Supervision of other high risk pregnancies, first trimester: Secondary | ICD-10-CM

## 2014-03-18 DIAGNOSIS — O0991 Supervision of high risk pregnancy, unspecified, first trimester: Secondary | ICD-10-CM

## 2014-03-18 LAB — POCT URINALYSIS DIP (DEVICE)
Bilirubin Urine: NEGATIVE
GLUCOSE, UA: NEGATIVE mg/dL
HGB URINE DIPSTICK: NEGATIVE
Ketones, ur: NEGATIVE mg/dL
NITRITE: NEGATIVE
Protein, ur: NEGATIVE mg/dL
Specific Gravity, Urine: 1.01 (ref 1.005–1.030)
Urobilinogen, UA: 0.2 mg/dL (ref 0.0–1.0)
pH: 6.5 (ref 5.0–8.0)

## 2014-03-18 MED ORDER — HYDROXYPROGESTERONE CAPROATE 250 MG/ML IM OIL
250.0000 mg | TOPICAL_OIL | Freq: Once | INTRAMUSCULAR | Status: AC
  Administered 2014-03-18: 250 mg via INTRAMUSCULAR

## 2014-03-18 NOTE — Progress Notes (Signed)
Uterine cramping during overexertion/dehydrated states; recommended adequate hydration and rest Will start weekly 17P today. Normal first screen, AFP only lab today. Anatomy scan already scheduled No other complaints or concerns.  Routine obstetric precautions reviewed.

## 2014-03-18 NOTE — Progress Notes (Signed)
Patient reports occasional braxton hicks

## 2014-03-18 NOTE — Addendum Note (Signed)
Addended by: Jill SideAY, Zaina Jenkin L on: 03/18/2014 10:16 AM   Modules accepted: Orders

## 2014-03-18 NOTE — Patient Instructions (Signed)
Return to clinic for any obstetric concerns or go to [facility] for evaluation  

## 2014-03-19 LAB — ALPHA FETOPROTEIN, MATERNAL
AFP: 71 IU/mL
Curr Gest Age: 17.1 wks.days
MoM for AFP: 1.84
OPEN SPINA BIFIDA: NEGATIVE

## 2014-03-20 ENCOUNTER — Encounter: Payer: Self-pay | Admitting: Obstetrics & Gynecology

## 2014-03-25 ENCOUNTER — Encounter: Payer: Self-pay | Admitting: Obstetrics & Gynecology

## 2014-03-25 ENCOUNTER — Ambulatory Visit (HOSPITAL_COMMUNITY)
Admission: RE | Admit: 2014-03-25 | Discharge: 2014-03-25 | Disposition: A | Discharge status: Home / Self Care | Payer: Medicaid Other | Source: Ambulatory Visit | Attending: Obstetrics & Gynecology | Admitting: Obstetrics & Gynecology

## 2014-03-25 ENCOUNTER — Other Ambulatory Visit: Payer: Self-pay | Admitting: Obstetrics & Gynecology

## 2014-03-25 ENCOUNTER — Ambulatory Visit (INDEPENDENT_AMBULATORY_CARE_PROVIDER_SITE_OTHER): Payer: Medicaid Other

## 2014-03-25 ENCOUNTER — Encounter (HOSPITAL_COMMUNITY): Payer: Self-pay

## 2014-03-25 VITALS — BP 96/63 | HR 86 | Wt 130.9 lb

## 2014-03-25 DIAGNOSIS — Z363 Encounter for antenatal screening for malformations: Secondary | ICD-10-CM | POA: Insufficient documentation

## 2014-03-25 DIAGNOSIS — O09529 Supervision of elderly multigravida, unspecified trimester: Secondary | ICD-10-CM

## 2014-03-25 DIAGNOSIS — O09212 Supervision of pregnancy with history of pre-term labor, second trimester: Secondary | ICD-10-CM

## 2014-03-25 DIAGNOSIS — O09892 Supervision of other high risk pregnancies, second trimester: Secondary | ICD-10-CM

## 2014-03-25 DIAGNOSIS — O2620 Pregnancy care for patient with recurrent pregnancy loss, unspecified trimester: Secondary | ICD-10-CM

## 2014-03-25 DIAGNOSIS — Z3689 Encounter for other specified antenatal screening: Secondary | ICD-10-CM

## 2014-03-25 DIAGNOSIS — O09219 Supervision of pregnancy with history of pre-term labor, unspecified trimester: Secondary | ICD-10-CM

## 2014-03-25 DIAGNOSIS — Z8751 Personal history of pre-term labor: Secondary | ICD-10-CM

## 2014-03-25 DIAGNOSIS — Z0489 Encounter for examination and observation for other specified reasons: Secondary | ICD-10-CM

## 2014-03-25 DIAGNOSIS — IMO0002 Reserved for concepts with insufficient information to code with codable children: Secondary | ICD-10-CM

## 2014-03-25 DIAGNOSIS — Z1389 Encounter for screening for other disorder: Secondary | ICD-10-CM | POA: Insufficient documentation

## 2014-03-25 MED ORDER — HYDROXYPROGESTERONE CAPROATE 250 MG/ML IM OIL
250.0000 mg | TOPICAL_OIL | Freq: Once | INTRAMUSCULAR | Status: AC
  Administered 2014-03-25: 250 mg via INTRAMUSCULAR

## 2014-04-01 ENCOUNTER — Ambulatory Visit (INDEPENDENT_AMBULATORY_CARE_PROVIDER_SITE_OTHER): Payer: Medicaid Other | Admitting: General Practice

## 2014-04-01 VITALS — BP 93/61 | HR 85 | Temp 97.5°F

## 2014-04-01 DIAGNOSIS — O09212 Supervision of pregnancy with history of pre-term labor, second trimester: Secondary | ICD-10-CM

## 2014-04-01 DIAGNOSIS — O09219 Supervision of pregnancy with history of pre-term labor, unspecified trimester: Secondary | ICD-10-CM

## 2014-04-01 MED ORDER — HYDROXYPROGESTERONE CAPROATE 250 MG/ML IM OIL
250.0000 mg | TOPICAL_OIL | INTRAMUSCULAR | Status: AC
  Administered 2014-04-01 – 2014-07-29 (×17): 250 mg via INTRAMUSCULAR

## 2014-04-07 ENCOUNTER — Encounter: Payer: Self-pay | Admitting: *Deleted

## 2014-04-08 ENCOUNTER — Encounter (HOSPITAL_COMMUNITY): Payer: Self-pay

## 2014-04-08 ENCOUNTER — Ambulatory Visit (INDEPENDENT_AMBULATORY_CARE_PROVIDER_SITE_OTHER): Payer: Medicaid Other | Admitting: *Deleted

## 2014-04-08 ENCOUNTER — Other Ambulatory Visit: Payer: Self-pay | Admitting: Obstetrics & Gynecology

## 2014-04-08 ENCOUNTER — Ambulatory Visit (HOSPITAL_COMMUNITY)
Admission: RE | Admit: 2014-04-08 | Discharge: 2014-04-08 | Disposition: A | Discharge status: Home / Self Care | Payer: Medicaid Other | Source: Ambulatory Visit | Attending: Obstetrics & Gynecology | Admitting: Obstetrics & Gynecology

## 2014-04-08 DIAGNOSIS — O09219 Supervision of pregnancy with history of pre-term labor, unspecified trimester: Secondary | ICD-10-CM

## 2014-04-08 DIAGNOSIS — Z3689 Encounter for other specified antenatal screening: Secondary | ICD-10-CM | POA: Insufficient documentation

## 2014-04-08 DIAGNOSIS — O2620 Pregnancy care for patient with recurrent pregnancy loss, unspecified trimester: Secondary | ICD-10-CM

## 2014-04-08 DIAGNOSIS — O09529 Supervision of elderly multigravida, unspecified trimester: Secondary | ICD-10-CM

## 2014-04-08 DIAGNOSIS — O09891 Supervision of other high risk pregnancies, first trimester: Secondary | ICD-10-CM

## 2014-04-08 DIAGNOSIS — O26879 Cervical shortening, unspecified trimester: Secondary | ICD-10-CM

## 2014-04-08 DIAGNOSIS — Z8751 Personal history of pre-term labor: Secondary | ICD-10-CM | POA: Diagnosis not present

## 2014-04-08 DIAGNOSIS — O09211 Supervision of pregnancy with history of pre-term labor, first trimester: Principal | ICD-10-CM

## 2014-04-08 NOTE — Addendum Note (Signed)
Addended by: Candelaria StagersHAIZLIP, Payton Prinsen E on: 04/08/2014 10:50 AM   Modules accepted: Level of Service

## 2014-04-08 NOTE — ED Notes (Signed)
Pt states she has been having cramping on and off.  Not sure if cramping is worse because she has 2 children at home that keep her on her feet.  Denies bleeding.

## 2014-04-15 ENCOUNTER — Ambulatory Visit (INDEPENDENT_AMBULATORY_CARE_PROVIDER_SITE_OTHER): Payer: Medicaid Other | Admitting: Family Medicine

## 2014-04-15 ENCOUNTER — Other Ambulatory Visit (HOSPITAL_COMMUNITY)
Admission: RE | Admit: 2014-04-15 | Discharge: 2014-04-15 | Disposition: A | Discharge status: Home / Self Care | Payer: Medicaid Other | Source: Ambulatory Visit | Attending: Obstetrics & Gynecology | Admitting: Obstetrics & Gynecology

## 2014-04-15 VITALS — BP 83/60 | HR 87 | Wt 132.5 lb

## 2014-04-15 DIAGNOSIS — O09211 Supervision of pregnancy with history of pre-term labor, first trimester: Secondary | ICD-10-CM

## 2014-04-15 DIAGNOSIS — O0990 Supervision of high risk pregnancy, unspecified, unspecified trimester: Secondary | ICD-10-CM

## 2014-04-15 DIAGNOSIS — B3731 Acute candidiasis of vulva and vagina: Secondary | ICD-10-CM

## 2014-04-15 DIAGNOSIS — B373 Candidiasis of vulva and vagina: Secondary | ICD-10-CM

## 2014-04-15 DIAGNOSIS — O0991 Supervision of high risk pregnancy, unspecified, first trimester: Secondary | ICD-10-CM

## 2014-04-15 DIAGNOSIS — O2390 Unspecified genitourinary tract infection in pregnancy, unspecified trimester: Secondary | ICD-10-CM

## 2014-04-15 DIAGNOSIS — O09219 Supervision of pregnancy with history of pre-term labor, unspecified trimester: Secondary | ICD-10-CM

## 2014-04-15 DIAGNOSIS — N898 Other specified noninflammatory disorders of vagina: Secondary | ICD-10-CM

## 2014-04-15 DIAGNOSIS — N760 Acute vaginitis: Secondary | ICD-10-CM | POA: Diagnosis present

## 2014-04-15 DIAGNOSIS — O09891 Supervision of other high risk pregnancies, first trimester: Secondary | ICD-10-CM

## 2014-04-15 DIAGNOSIS — O26892 Other specified pregnancy related conditions, second trimester: Principal | ICD-10-CM

## 2014-04-15 LAB — POCT URINALYSIS DIP (DEVICE)
BILIRUBIN URINE: NEGATIVE
Glucose, UA: NEGATIVE mg/dL
HGB URINE DIPSTICK: NEGATIVE
Ketones, ur: NEGATIVE mg/dL
Nitrite: NEGATIVE
PH: 7 (ref 5.0–8.0)
PROTEIN: NEGATIVE mg/dL
Specific Gravity, Urine: 1.015 (ref 1.005–1.030)
Urobilinogen, UA: 1 mg/dL (ref 0.0–1.0)

## 2014-04-15 MED ORDER — FLUCONAZOLE 150 MG PO TABS: ORAL_TABLET | ORAL | Status: DC

## 2014-04-15 NOTE — Progress Notes (Signed)
Pt reports having contractions when not hydrated and "over do it" Pt would like something for a yeast infection

## 2014-04-15 NOTE — Progress Notes (Signed)
Patient is 38 y.o. [id number] 8061w1d by redating sono 6w on 17-P.  +FM, denies LOF, VB. +vaginal discharge with candida intertrigo under arms - contractions ==> usually at night, when dehydrated, rarely up to 1-2 but not consistent, none currently - sono reviewed, CL normal ==> recommended q2wk CL 24-28w, have been scheduled ==> rx diflucan

## 2014-04-15 NOTE — Addendum Note (Signed)
Addended by: Kathee DeltonHILLMAN, Griffin Dewilde L on: 04/15/2014 11:51 AM   Modules accepted: Orders

## 2014-04-22 ENCOUNTER — Ambulatory Visit (HOSPITAL_COMMUNITY)
Admission: RE | Admit: 2014-04-22 | Discharge: 2014-04-22 | Disposition: A | Discharge status: Home / Self Care | Payer: Medicaid Other | Source: Ambulatory Visit | Attending: Family Medicine | Admitting: Family Medicine

## 2014-04-22 ENCOUNTER — Ambulatory Visit: Payer: Medicaid Other

## 2014-04-22 ENCOUNTER — Encounter: Payer: Self-pay | Admitting: Obstetrics & Gynecology

## 2014-04-22 ENCOUNTER — Encounter (HOSPITAL_COMMUNITY): Payer: Self-pay

## 2014-04-22 DIAGNOSIS — O2620 Pregnancy care for patient with recurrent pregnancy loss, unspecified trimester: Secondary | ICD-10-CM | POA: Diagnosis not present

## 2014-04-22 DIAGNOSIS — Z8751 Personal history of pre-term labor: Secondary | ICD-10-CM

## 2014-04-22 DIAGNOSIS — Z3689 Encounter for other specified antenatal screening: Secondary | ICD-10-CM | POA: Insufficient documentation

## 2014-04-22 DIAGNOSIS — O09529 Supervision of elderly multigravida, unspecified trimester: Secondary | ICD-10-CM | POA: Diagnosis present

## 2014-04-22 DIAGNOSIS — O26879 Cervical shortening, unspecified trimester: Secondary | ICD-10-CM | POA: Diagnosis not present

## 2014-04-22 NOTE — Progress Notes (Signed)
MATERNAL FETAL MEDICINE CONSULT  Patient Name: Barbara McmurrayBarbara L Tyler Medical Record Number:  [id number] Date of Birth: Feb 21, 1976 Requesting Physician Name:  Perry MountKristy Rocio Acosta, MD Date of Service: 04/22/2014  Chief Complaint Cervical shortening  History of Present Illness Barbara McmurrayBarbara L Tyler was seen today secondary to cervical shortening at the request of Perry MountKristy Rocio Acosta, MD.  The patient is a 38 y.o. [id number],at 2078w1d with an EDD of 08/25/2014, by Ultrasound dating method.  Barbara Tyler has a history of a prior preterm birth, followed by a term birth, and is receiving weekly 17-OH progesterone and is being followed with serial cervical length measurements.  Earlier today she was found to have a cervical length of 1.8 cm with funneling on an ultrasound earlier today.  She has no abdominal pain, contractions, cramping, pelvic pressure, vaginal bleeding, or loss of fluid.  She returns with her husband to discuss management options.    Review of Systems Pertinent items are noted in HPI.  Patient History OB History  Gravida Para Term Preterm AB SAB TAB Ectopic Multiple Living  7 2 1 1 4 4    2     # Outcome Date GA Lbr Len/2nd Weight Sex Delivery Anes PTL Lv  7 CUR           6 TRM 10/04/09 3220w0d  7 lb 7 oz (3.374 kg) F SVD  N Y  5 PRE 09/17/04 3520w0d  6 lb (2.722 kg) M SVD None Y Y     Comments: PPROM  4 SAB           3 SAB           2 SAB           1 SAB               Past Medical History  Diagnosis Date  . Preterm labor   . Infection     UTI  . Kidney stones   . Abnormal Pap smear     cryo, clean since  . Depression     Past Surgical History  Procedure Laterality Date  . No past surgeries    . Cryotherapy    . Dilation and evacuation N/A 02/27/2013    Procedure: DILATATION AND EVACUATION;  Surgeon: Reva Boresanya S Pratt, MD;  Location: [facility];  Service: Gynecology;  Laterality: N/A;    History   Social History  . Marital Status: Single    Spouse Name: N/A    Number of Children:  N/A  . Years of Education: N/A   Social History Main Topics  . Smoking status: Never Smoker   . Smokeless tobacco: Never Used  . Alcohol Use: Yes     Comment: rare  . Drug Use: No  . Sexual Activity: Yes    Birth Control/ Protection: None   Other Topics Concern  . Not on file   Social History Narrative  . No narrative on file    Family History  Problem Relation Age of Onset  . Cancer Father   . Cancer Maternal Grandmother     breast- great grandma   In addition, the patient has no family history of mental retardation, birth defects, or genetic diseases.  Physical Examination There were no vitals filed for this visit. General appearance - alert, well appearing, and in no distress Abdomen - soft, nontender, nondistended, no masses or organomegaly Extremities - no pedal edema noted  Assessment and Recommendations 1.  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  Barbara Tyler is being followed with serial transvaginal cervical length measurements due to a history of a prior 32 weeks delivery--which was followed by a term delivery.  Today her cervix measures 1.8 cm and demonstrates funneling.  In this setting an ultrasound indicated cerclage ins an option in a women with a history of preterm birth.  However, given the intervening term birth, her late gestational age, and the relatively modest degree of cervical shortening, expectant management is also a reasonable optoins.  I have reviewed the risks and benefits of both cerclage placement and expectant management.  She would like to proceed with ultrasound indicated cerclage placement.  I spent 30 minutes with Barbara Tyler today of which 50% was face-to-face counseling.  Thank you for referring Barbara Tyler to the [facility].  Please do not hesitate to contact us with questions.   Rema Fendt, MD

## 2014-04-23 ENCOUNTER — Encounter (HOSPITAL_COMMUNITY): Payer: Self-pay | Admitting: Pharmacy Technician

## 2014-04-23 ENCOUNTER — Encounter (HOSPITAL_COMMUNITY): Payer: Medicaid Other | Admitting: Certified Registered"

## 2014-04-23 ENCOUNTER — Ambulatory Visit (HOSPITAL_COMMUNITY)
Admission: RE | Admit: 2014-04-23 | Discharge: 2014-04-23 | Disposition: A | Discharge status: Home / Self Care | Payer: Medicaid Other | Source: Ambulatory Visit | Attending: Family Medicine | Admitting: Family Medicine

## 2014-04-23 ENCOUNTER — Ambulatory Visit: Payer: Medicaid Other

## 2014-04-23 ENCOUNTER — Ambulatory Visit (HOSPITAL_COMMUNITY): Payer: Medicaid Other | Admitting: Certified Registered"

## 2014-04-23 ENCOUNTER — Encounter (HOSPITAL_COMMUNITY): Payer: Self-pay | Admitting: Family Medicine

## 2014-04-23 ENCOUNTER — Encounter (HOSPITAL_COMMUNITY)
Admission: RE | Disposition: A | Discharge status: Home / Self Care | Payer: Self-pay | Source: Ambulatory Visit | Attending: Family Medicine

## 2014-04-23 DIAGNOSIS — D689 Coagulation defect, unspecified: Secondary | ICD-10-CM | POA: Insufficient documentation

## 2014-04-23 DIAGNOSIS — F3289 Other specified depressive episodes: Secondary | ICD-10-CM | POA: Diagnosis not present

## 2014-04-23 DIAGNOSIS — O26879 Cervical shortening, unspecified trimester: Secondary | ICD-10-CM | POA: Diagnosis present

## 2014-04-23 DIAGNOSIS — E079 Disorder of thyroid, unspecified: Secondary | ICD-10-CM | POA: Insufficient documentation

## 2014-04-23 DIAGNOSIS — E039 Hypothyroidism, unspecified: Secondary | ICD-10-CM | POA: Diagnosis not present

## 2014-04-23 DIAGNOSIS — O99119 Other diseases of the blood and blood-forming organs and certain disorders involving the immune mechanism complicating pregnancy, unspecified trimester: Secondary | ICD-10-CM

## 2014-04-23 DIAGNOSIS — O99280 Endocrine, nutritional and metabolic diseases complicating pregnancy, unspecified trimester: Secondary | ICD-10-CM

## 2014-04-23 DIAGNOSIS — O99340 Other mental disorders complicating pregnancy, unspecified trimester: Secondary | ICD-10-CM | POA: Insufficient documentation

## 2014-04-23 DIAGNOSIS — F329 Major depressive disorder, single episode, unspecified: Secondary | ICD-10-CM | POA: Insufficient documentation

## 2014-04-23 DIAGNOSIS — O26872 Cervical shortening, second trimester: Secondary | ICD-10-CM

## 2014-04-23 HISTORY — PX: CERVICAL CERCLAGE: SHX1329

## 2014-04-23 LAB — CBC
HEMATOCRIT: 38 % (ref 36.0–46.0)
Hemoglobin: 13 g/dL (ref 12.0–15.0)
MCH: 31.5 pg (ref 26.0–34.0)
MCHC: 34.2 g/dL (ref 30.0–36.0)
MCV: 92 fL (ref 78.0–100.0)
Platelets: 281 10*3/uL (ref 150–400)
RBC: 4.13 MIL/uL (ref 3.87–5.11)
RDW: 12.9 % (ref 11.5–15.5)
WBC: 8.9 10*3/uL (ref 4.0–10.5)

## 2014-04-23 SURGERY — CERCLAGE, CERVIX, VAGINAL APPROACH
Anesthesia: Monitor Anesthesia Care | Site: Vagina

## 2014-04-23 MED ORDER — FENTANYL CITRATE 0.05 MG/ML IJ SOLN
INTRAMUSCULAR | Status: AC
  Filled 2014-04-23: qty 2

## 2014-04-23 MED ORDER — LACTATED RINGERS IV SOLN
INTRAVENOUS | Status: DC
  Administered 2014-04-23: 14:00:00 via INTRAVENOUS

## 2014-04-23 MED ORDER — PHENYLEPHRINE HCL 10 MG/ML IJ SOLN
INTRAMUSCULAR | Status: DC | PRN
  Administered 2014-04-23 (×2): 80 ug via INTRAVENOUS
  Administered 2014-04-23: 40 ug via INTRAVENOUS

## 2014-04-23 MED ORDER — MEPERIDINE HCL 25 MG/ML IJ SOLN: 6.2500 mg | INTRAMUSCULAR | Status: DC | PRN

## 2014-04-23 MED ORDER — LIDOCAINE IN DEXTROSE 5-7.5 % IV SOLN
INTRAVENOUS | Status: AC
  Filled 2014-04-23: qty 2

## 2014-04-23 MED ORDER — MIDAZOLAM HCL 2 MG/2ML IJ SOLN
INTRAMUSCULAR | Status: AC
  Filled 2014-04-23: qty 2

## 2014-04-23 MED ORDER — BUPIVACAINE HCL (PF) 0.25 % IJ SOLN
INTRAMUSCULAR | Status: DC | PRN
  Administered 2014-04-23: 20 mL

## 2014-04-23 MED ORDER — ONDANSETRON HCL 4 MG/2ML IJ SOLN
INTRAMUSCULAR | Status: AC
  Filled 2014-04-23: qty 2

## 2014-04-23 MED ORDER — KETOROLAC TROMETHAMINE 30 MG/ML IJ SOLN
INTRAMUSCULAR | Status: AC
  Filled 2014-04-23: qty 1

## 2014-04-23 MED ORDER — 0.9 % SODIUM CHLORIDE (POUR BTL) OPTIME
TOPICAL | Status: DC | PRN
  Administered 2014-04-23: 1000 mL

## 2014-04-23 MED ORDER — SCOPOLAMINE 1 MG/3DAYS TD PT72
1.0000 | MEDICATED_PATCH | Freq: Once | TRANSDERMAL | Status: DC
  Administered 2014-04-23: 1.5 mg via TRANSDERMAL

## 2014-04-23 MED ORDER — BUPIVACAINE HCL (PF) 0.25 % IJ SOLN
INTRAMUSCULAR | Status: AC
  Filled 2014-04-23: qty 30

## 2014-04-23 MED ORDER — LIDOCAINE HCL (CARDIAC) 20 MG/ML IV SOLN
INTRAVENOUS | Status: AC
  Filled 2014-04-23: qty 5

## 2014-04-23 MED ORDER — PHENYLEPHRINE 40 MCG/ML (10ML) SYRINGE FOR IV PUSH (FOR BLOOD PRESSURE SUPPORT)
PREFILLED_SYRINGE | INTRAVENOUS | Status: AC
  Filled 2014-04-23: qty 5

## 2014-04-23 MED ORDER — PROPOFOL 10 MG/ML IV EMUL
INTRAVENOUS | Status: AC
  Filled 2014-04-23: qty 20

## 2014-04-23 MED ORDER — PROPOFOL 10 MG/ML IV BOLUS
INTRAVENOUS | Status: DC | PRN
  Administered 2014-04-23 (×3): 20 mg via INTRAVENOUS
  Administered 2014-04-23: 30 mg via INTRAVENOUS
  Administered 2014-04-23 (×3): 20 mg via INTRAVENOUS

## 2014-04-23 MED ORDER — INDOMETHACIN 50 MG PO CAPS
50.0000 mg | ORAL_CAPSULE | Freq: Four times a day (QID) | ORAL | Status: DC
Start: 2014-04-23 — End: 2014-04-24

## 2014-04-23 MED ORDER — LACTATED RINGERS IV SOLN
INTRAVENOUS | Status: DC | PRN
  Administered 2014-04-23 (×2): via INTRAVENOUS

## 2014-04-23 MED ORDER — HYDROXYPROGESTERONE CAPROATE 250 MG/ML IM OIL
250.0000 mg | TOPICAL_OIL | Freq: Once | INTRAMUSCULAR | Status: AC
  Administered 2014-04-23: 250 mg via INTRAMUSCULAR
  Filled 2014-04-23: qty 1

## 2014-04-23 MED ORDER — MIDAZOLAM HCL 5 MG/5ML IJ SOLN
INTRAMUSCULAR | Status: DC | PRN
  Administered 2014-04-23: 2 mg via INTRAVENOUS

## 2014-04-23 MED ORDER — LIDOCAINE IN DEXTROSE 5-7.5 % IV SOLN
INTRAVENOUS | Status: DC | PRN
  Administered 2014-04-23: 1 mL via INTRATHECAL

## 2014-04-23 MED ORDER — FENTANYL CITRATE 0.05 MG/ML IJ SOLN
INTRAMUSCULAR | Status: DC | PRN
  Administered 2014-04-23 (×2): 50 ug via INTRAVENOUS

## 2014-04-23 MED ORDER — DEXAMETHASONE SODIUM PHOSPHATE 10 MG/ML IJ SOLN
INTRAMUSCULAR | Status: AC
  Filled 2014-04-23: qty 1

## 2014-04-23 MED ORDER — FENTANYL CITRATE 0.05 MG/ML IJ SOLN: 25.0000 ug | INTRAMUSCULAR | Status: DC | PRN

## 2014-04-23 MED ORDER — SCOPOLAMINE 1 MG/3DAYS TD PT72
MEDICATED_PATCH | TRANSDERMAL | Status: AC
  Administered 2014-04-23: 1.5 mg via TRANSDERMAL
  Filled 2014-04-23: qty 1

## 2014-04-23 MED ORDER — METOCLOPRAMIDE HCL 5 MG/ML IJ SOLN: 10.0000 mg | Freq: Once | INTRAMUSCULAR | Status: DC | PRN

## 2014-04-23 MED ORDER — KETOROLAC TROMETHAMINE 30 MG/ML IJ SOLN: 15.0000 mg | Freq: Once | INTRAMUSCULAR | Status: DC | PRN

## 2014-04-23 SURGICAL SUPPLY — 18 items
CLOTH BEACON ORANGE TIMEOUT ST (SAFETY) ×3 IMPLANT
COUNTER NEEDLE 1200 MAGNETIC (NEEDLE) ×2 IMPLANT
GLOVE BIOGEL PI IND STRL 7.0 (GLOVE) ×1 IMPLANT
GLOVE BIOGEL PI INDICATOR 7.0 (GLOVE) ×2
GLOVE ECLIPSE 7.0 STRL STRAW (GLOVE) ×6 IMPLANT
GOWN STRL REUS W/TWL LRG LVL3 (GOWN DISPOSABLE) ×9 IMPLANT
NEEDLE MAYO .5 CIRCLE (NEEDLE) ×3 IMPLANT
PACK VAGINAL MINOR WOMEN LF (CUSTOM PROCEDURE TRAY) ×3 IMPLANT
PAD OB MATERNITY 4.3X12.25 (PERSONAL CARE ITEMS) ×3 IMPLANT
PAD PREP 24X48 CUFFED NSTRL (MISCELLANEOUS) ×3 IMPLANT
SUT MERSILENE 5MM BP 1 12 (SUTURE) ×3 IMPLANT
SYR BULB IRRIGATION 50ML (SYRINGE) ×3 IMPLANT
TOWEL OR 17X24 6PK STRL BLUE (TOWEL DISPOSABLE) ×6 IMPLANT
TRAY FOLEY CATH 14FR (SET/KITS/TRAYS/PACK) ×3 IMPLANT
TUBING NON-CON 1/4 X 20 CONN (TUBING) ×1 IMPLANT
TUBING NON-CON 1/4 X 20' CONN (TUBING) ×1
WATER STERILE IRR 1000ML POUR (IV SOLUTION) ×3 IMPLANT
YANKAUER SUCT BULB TIP NO VENT (SUCTIONS) ×2 IMPLANT

## 2014-04-23 NOTE — Transfer of Care (Signed)
Immediate Anesthesia Transfer of Care Note  Patient: Barbara Tyler  Procedure(s) Performed: Procedure(s): CERCLAGE CERVICAL (N/A)  Patient Location: PACU  Anesthesia Type:Spinal  Level of Consciousness: awake  Airway & Oxygen Therapy: Patient Spontanous Breathing  Post-op Assessment: Report given to PACU RN and Post -op Vital signs reviewed and stable  Post vital signs: stable  Complications: No apparent anesthesia complications

## 2014-04-23 NOTE — Op Note (Signed)
Preoperative diagnosis: Shortened cervix   Postoperative diagnosis: Same  Procedure: Cervical cerclage  Surgeon: Tinnie Gensanya Genie Wenke, M.D.  Anesthesia: Spinal - Dana AllanAmy Cassidy, MD, paracervical block - Shawnie PonsPratt  Findings: cervix open 1.5 cm, soft, short  Estimated blood loss: 50 cc  Specimens: None  Reason for procedure: Barbara McmurrayBarbara L Tyler [id number] 6645w2d, with h/o 32 wk delivery on 17P, found to have shortened cervix who desired rescue cerclage.  Procedure: Patient was taken to the operating room where spinal analgesia was administered. She was prepped and draped in the usual sterile fashion.  A Foley catheter is used to drain her bladder. A timeout was performed. The patient had SCDs in place. The patient was in dorsal lithotomy.  A weighted speculum was placed inside the vagina.  A Deaver was used anteriorly. The cervix was grasped with an open ring  Forcep. A paracervical block was performed with 0.25% Marcaine. A 5 mm Mersilene band on a cutting needle was used and to put in a pursestring suture. This was started at 12:00 and exiting at 9:00, starting at 9:00 and exiting at 6:00, starting at 6:00 and exiting at 3:00, starting at 3:00 and exiting at 12:00. A suture was then tied down. Hemostasis secured. All instrument, needle and lap counts were correct x 2. The patient was taken to recovery in stable condition.  Barbara Tyler SMD 04/23/2014 3:03 PM

## 2014-04-23 NOTE — Anesthesia Preprocedure Evaluation (Signed)
Anesthesia Evaluation  Patient identified by MRN, date of birth, ID band Patient awake    Reviewed: Allergy & Precautions, H&P , NPO status , Patient's Chart, lab work & pertinent test results, reviewed documented beta blocker date and time   History of Anesthesia Complications Negative for: history of anesthetic complications  Airway Mallampati: I TM Distance: >3 FB Neck ROM: full    Dental  (+) Teeth Intact   Pulmonary neg pulmonary ROS,  breath sounds clear to auscultation        Cardiovascular negative cardio ROS  Rhythm:regular Rate:Normal     Neuro/Psych negative neurological ROS  negative psych ROS   GI/Hepatic negative GI ROS, Neg liver ROS,   Endo/Other  Hypothyroidism   Renal/GU H/i kidney stones     Musculoskeletal   Abdominal   Peds  Hematology  (+) Blood dyscrasia (factor V Leiden heterozygote), ,   Anesthesia Other Findings   Reproductive/Obstetrics (+) Pregnancy (22 weeks, shortened cervix)                           Anesthesia Physical Anesthesia Plan  ASA: II  Anesthesia Plan: Spinal and MAC   Post-op Pain Management:    Induction:   Airway Management Planned:   Additional Equipment:   Intra-op Plan:   Post-operative Plan:   Informed Consent: I have reviewed the patients History and Physical, chart, labs and discussed the procedure including the risks, benefits and alternatives for the proposed anesthesia with the patient or authorized representative who has indicated his/her understanding and acceptance.     Plan Discussed with: Surgeon and CRNA  Anesthesia Plan Comments:         Anesthesia Quick Evaluation

## 2014-04-23 NOTE — Anesthesia Procedure Notes (Signed)
Spinal  Patient location during procedure: OR Start time: 04/23/2014 2:35 PM Staffing Anesthesiologist: CASSIDY, AMY Performed by: anesthesiologist  Preanesthetic Checklist Completed: patient identified, site marked, surgical consent, pre-op evaluation, timeout performed, IV checked, risks and benefits discussed and monitors and equipment checked Spinal Block Patient position: sitting Prep: site prepped and draped and DuraPrep Patient monitoring: continuous pulse ox and blood pressure Approach: midline Location: L3-4 Injection technique: single-shot Needle Needle type: Pencan  Needle gauge: 24 G Needle length: 10 cm Assessment Sensory level: T8 Additional Notes Clear free flow CSF on first pass.  No paresthesia.  Patient tolerated procedure well with no apparent complications.  Jasmine DecemberA. Cassidy, MD

## 2014-04-23 NOTE — Discharge Instructions (Signed)
Cervical Cerclage Cervical cerclage is a surgical procedure for an incompetent cervix. An incompetent cervix is a weak cervix that opens up before labor begins. Cervical cerclage is a procedure in which the cervix is sewn closed during pregnancy.  LET YOUR HEALTH CARE PROVIDER KNOW ABOUT:   Any allergies you have.  All medicines you are taking, including vitamins, herbs, eye drops, creams, and over-the-counter medicines.  Previous problems you or members of your family have had with the use of anesthetics.  Any blood disorders you have.  Previous surgeries you have had.  Medical conditions you have.  Any recent colds or infections. RISKS AND COMPLICATIONS  Generally, this is a safe procedure. However, as with any procedure, problems can occur. Possible problems include:  Infection.  Bleeding.  Rupturing the amniotic sac (membranes).  Going into early labor and delivery.  Problems with the anesthetics.  Infection of the amniotic sac. BEFORE THE PROCEDURE   Ask your health care provider about changing or stopping your medicines.  Do not eat or drink anything for 6-8 hours before the procedure.  Arrange for someone to drive you home after the procedure. PROCEDURE   An IV tube will be placed in your vein. You will be given a sedative to help you relax.  You will be given a medicine that makes you sleep through the procedure (general anesthetic) or a medicine injected into your spine that numbs your body below the waist (spinal or epidural anesthetic). You will be asleep or be numbed through the entire procedure.  A speculum will be placed in your vagina to visualize your cervix.  The cervix is then grasped and stitched closed tightly.  Ultrasound may be used to guide the procedure and monitor the baby. AFTER THE PROCEDURE   You will go to a recovery room where you and your unborn baby are monitored. Once you are awake, stable, and taking fluids well, you will be allowed  to return to your room.  You will usually stay in the hospital overnight.  You may get an injection of progesterone to prevent uterine contractions.  You may be given pain-relieving medicines to take with you when you go home.  Have someone drive you home and stay with you for up to 2 days. Document Released: 08/23/2008 Document Revised: 09/15/2013 Document Reviewed: 04/01/2013 Exit[employer] Patient Information 2015 [employer]. This information is not intended to replace advice given to you by your health care provider. Make sure you discuss any questions you have with your health care provider.  

## 2014-04-23 NOTE — H&P (Signed)
  Barbara McmurrayBarbara L Haver is an 38 y.o. [id number] 4814w2d female.    Chief Complaint: Shortened cervix  HPI: Pt. Seen with shortened cervix in MFM yesterday.  She is on 17P for previous preterm birth. She has no abdominal pain, contractions, cramping, pelvic pressure, vaginal bleeding, or loss of fluid.    Past Medical History  Diagnosis Date  . Preterm labor   . Infection     UTI  . Kidney stones   . Abnormal Pap smear     cryo, clean since  . Depression     Past Surgical History  Procedure Laterality Date  . No past surgeries    . Cryotherapy    . Dilation and evacuation N/A 02/27/2013    Procedure: DILATATION AND EVACUATION;  Surgeon: Reva Boresanya S Ahmet Schank, MD;  Location: [facility];  Service: Gynecology;  Laterality: N/A;    Family History  Problem Relation Age of Onset  . Cancer Father   . Cancer Maternal Grandmother     breast- great grandma   Social History:  reports that she has never smoked. She has never used smokeless tobacco. She reports that she drinks alcohol. She reports that she does not use illicit drugs.  Allergies:  Allergies  Allergen Reactions  . Latex Itching and Rash    Burning of skin    No current facility-administered medications on file prior to encounter.   Current Outpatient Prescriptions on File Prior to Encounter  Medication Sig Dispense Refill  . aspirin 81 MG tablet Take 81 mg by mouth daily.        Pertinent items are noted in HPI.  Blood pressure 104/61, pulse 95, temperature 98.2 F (36.8 C), temperature source Oral, resp. rate 18, height 5\' 4"  (1.626 m), weight 130 lb (58.968 kg), last menstrual period 10/25/2013, SpO2 100.00%. BP 104/61  Pulse 95  Temp(Src) 98.2 F (36.8 C) (Oral)  Resp 18  Ht 5\' 4"  (1.626 m)  Wt 130 lb (58.968 kg)  BMI 22.30 kg/m2  SpO2 100%  LMP 10/25/2013 General appearance: alert, cooperative and appears stated age Head: Normocephalic, without obvious abnormality, atraumatic Neck: supple, symmetrical, trachea  midline Lungs: clear to auscultation bilaterally Heart: regular rate and rhythm Abdomen: soft, non-tender; bowel sounds normal; no masses,  no organomegaly Extremities: extremities normal, atraumatic, no cyanosis or edema Skin: Skin color, texture, turgor normal. No rashes or lesions Neurologic: Grossly normal   Lab Results  Component Value Date   WBC 8.9 04/23/2014   HGB 13.0 04/23/2014   HCT 38.0 04/23/2014   MCV 92.0 04/23/2014   PLT 281 04/23/2014   Lab Results  Component Value Date   PREGTESTUR NEGATIVE 12/14/2013     Assessment/Plan Patient Active Problem List   Diagnosis Date Noted  . Short cervix, antepartum 04/22/2014  . Supervision of high risk pregnancy in first trimester 01/06/2014  . History of preterm delivery, currently pregnant in first trimester 01/06/2014  . Pain, dental 01/06/2014  . Subclinical hypothyroidism 04/01/2013  . Heterozygous factor V Leiden mutation 04/01/2013  . History of recurrent abortion, currently pregnant 03/23/2013  . Depression 03/23/2013   For cervical cerclage. Risks include but are not limited to bleeding, infection, injury to surrounding structures, including bowel, bladder and ureters, blood clots, and death.  Likelihood of success is high. There is a small risk of ROM associated with this.   Zander Ingham S 04/23/2014, 1:34 PM

## 2014-04-23 NOTE — Anesthesia Postprocedure Evaluation (Signed)
  Anesthesia Post-op Note  Anesthesia Post Note  Patient: Barbara McmurrayBarbara L Tyler  Procedure(s) Performed: Procedure(s) (LRB): CERCLAGE CERVICAL (N/A)  Anesthesia type: Spinal  Patient location: PACU  Post pain: Pain level controlled  Post assessment: Post-op Vital signs reviewed  Last Vitals:  Filed Vitals:   04/23/14 1600  BP: 99/64  Pulse: 73  Temp:   Resp: 17    Post vital signs: Reviewed  Level of consciousness: awake  Complications: No apparent anesthesia complications

## 2014-04-24 ENCOUNTER — Inpatient Hospital Stay (HOSPITAL_COMMUNITY)
Admission: AD | Admit: 2014-04-24 | Discharge: 2014-04-24 | Disposition: A | Discharge status: Home / Self Care | Payer: Medicaid Other | Source: Ambulatory Visit | Attending: Obstetrics and Gynecology | Admitting: Obstetrics and Gynecology

## 2014-04-24 ENCOUNTER — Encounter (HOSPITAL_COMMUNITY): Payer: Self-pay | Admitting: *Deleted

## 2014-04-24 DIAGNOSIS — D689 Coagulation defect, unspecified: Secondary | ICD-10-CM | POA: Diagnosis not present

## 2014-04-24 DIAGNOSIS — O99119 Other diseases of the blood and blood-forming organs and certain disorders involving the immune mechanism complicating pregnancy, unspecified trimester: Secondary | ICD-10-CM

## 2014-04-24 DIAGNOSIS — E079 Disorder of thyroid, unspecified: Secondary | ICD-10-CM | POA: Insufficient documentation

## 2014-04-24 DIAGNOSIS — D6859 Other primary thrombophilia: Secondary | ICD-10-CM | POA: Diagnosis not present

## 2014-04-24 DIAGNOSIS — E039 Hypothyroidism, unspecified: Secondary | ICD-10-CM | POA: Diagnosis not present

## 2014-04-24 DIAGNOSIS — O99891 Other specified diseases and conditions complicating pregnancy: Secondary | ICD-10-CM | POA: Diagnosis not present

## 2014-04-24 DIAGNOSIS — O99280 Endocrine, nutritional and metabolic diseases complicating pregnancy, unspecified trimester: Secondary | ICD-10-CM | POA: Diagnosis not present

## 2014-04-24 DIAGNOSIS — O9989 Other specified diseases and conditions complicating pregnancy, childbirth and the puerperium: Principal | ICD-10-CM

## 2014-04-24 DIAGNOSIS — O99340 Other mental disorders complicating pregnancy, unspecified trimester: Secondary | ICD-10-CM

## 2014-04-24 DIAGNOSIS — F419 Anxiety disorder, unspecified: Secondary | ICD-10-CM

## 2014-04-24 DIAGNOSIS — O26872 Cervical shortening, second trimester: Secondary | ICD-10-CM

## 2014-04-24 DIAGNOSIS — F411 Generalized anxiety disorder: Secondary | ICD-10-CM

## 2014-04-24 HISTORY — DX: Activated protein C resistance: D68.51

## 2014-04-24 HISTORY — DX: Hypothyroidism, unspecified: E03.9

## 2014-04-24 MED ORDER — HYDROXYZINE PAMOATE 25 MG PO CAPS: 25.0000 mg | ORAL_CAPSULE | Freq: Three times a day (TID) | ORAL | Status: DC | PRN

## 2014-04-24 MED ORDER — DIPHENHYDRAMINE HCL 50 MG/ML IJ SOLN
50.0000 mg | Freq: Once | INTRAMUSCULAR | Status: AC
  Administered 2014-04-24: 50 mg via INTRAMUSCULAR
  Filled 2014-04-24: qty 1

## 2014-04-24 MED ORDER — CYCLOBENZAPRINE HCL 5 MG PO TABS
5.0000 mg | ORAL_TABLET | Freq: Once | ORAL | Status: AC
  Administered 2014-04-24: 5 mg via ORAL
  Filled 2014-04-24: qty 1

## 2014-04-24 NOTE — MAU Note (Signed)
Patient arrived via EMS. States about an hour ago she started feeling weird. "I don't know what is wrong with me." EMS reported that patient was pacing around the house when they arrived. Patient had cerclage placed yesterday. In addition to other described feelings patient states she wants to make sure the baby is OK.

## 2014-04-24 NOTE — Progress Notes (Signed)
Dr. Loreta AveAcosta spoke with patient about her feelings and concerns. MD reassured patient, ordered an additional med. Patient appears calmer. States she feels a little better.

## 2014-04-24 NOTE — MAU Note (Addendum)
Patient pacing around in the hallway. States, "I feel like I am losing touch with reality. I feel like I am going to have a psychotic break." When asked if she has had a psychotic break before, patient states, "no." Patient denies hx of psychotic issues. Denies any use of psychotic meds, tranquilizers, sedatives. Scope patch removed from behind ear. Patient wants husband to have someone come and pick up her daughter so her daughter won't see her like this.

## 2014-04-24 NOTE — MAU Provider Note (Signed)
History     CSN: [id number]  Arrival date and time: 04/24/14 1353   None     Chief Complaint  Patient presents with  . "I don't know what is wrong with me." "I feel weird."    HPI  [id number] [redacted]w[redacted]d POD#1 s/p cerclage here today with complaints that, "I feel like I am having a psychotic break."  Reported that earlier today she felt weird, made some bread to calm down, took a nap.  After awakening she felt a "wave of panic" come over her, reports crawling on the ground.  - denies hx of anxiety, no previous medications, no hx of depression, contracts for safety - denies SI, HI, auditory/visual hallucinations, denies depression symptoms -  +FM, denies LOF, VB, contractions, vaginal discharge; +VB but has improved since cerclage yesterday and almost resolved  Would like to stop taking indomethacin   Past Medical History  Diagnosis Date  . Preterm labor   . Infection     UTI  . Kidney stones   . Abnormal Pap smear     cryo, clean since  . Depression   . Factor 5 Leiden mutation, heterozygous   . Hypothyroidism     Past Surgical History  Procedure Laterality Date  . No past surgeries    . Cryotherapy    . Dilation and evacuation N/A 02/27/2013    Procedure: DILATATION AND EVACUATION;  Surgeon: Reva Bores, MD;  Location: [facility];  Service: Gynecology;  Laterality: N/A;  . Cervical cerclage      Family History  Problem Relation Age of Onset  . Cancer Father   . Cancer Maternal Grandmother     breast- great grandma    History  Substance Use Topics  . Smoking status: Never Smoker   . Smokeless tobacco: Never Used  . Alcohol Use: Yes     Comment: rare    Allergies:  Allergies  Allergen Reactions  . Latex Itching and Rash    Burning of skin    Facility-administered medications prior to admission  Medication Dose Route Frequency Provider Last Rate Last Dose  . hydroxyprogesterone caproate (DELALUTIN) 250 mg/mL injection 250 mg  250 mg Intramuscular Weekly Lesly Dukes, MD   250 mg at 04/15/14 1154   Prescriptions prior to admission  Medication Sig Dispense Refill  . aspirin 81 MG tablet Take 81 mg by mouth daily.      . indomethacin (INDOCIN) 50 MG capsule Take 1 capsule (50 mg total) by mouth 4 (four) times daily.  12 capsule  0  . Levothyroxine Sodium (TIROSINT) 75 MCG CAPS Take 1 capsule by mouth daily before breakfast.      . OVER THE COUNTER MEDICATION Take 1 capsule by mouth daily. Caprylic acid 100mg       . Probiotic Product (PROBIOTIC PO) Take 1 tablet by mouth daily.        Review of Systems  Constitutional: Negative for chills, weight loss and malaise/fatigue.  Respiratory: Negative for cough, sputum production and shortness of breath.   Cardiovascular: Negative for chest pain, palpitations and claudication.  Gastrointestinal: Negative for nausea and vomiting.  Genitourinary: Negative for dysuria and urgency.  Neurological: Negative for dizziness and tingling.  Psychiatric/Behavioral: Negative for depression, suicidal ideas, hallucinations, memory loss and substance abuse. The patient is nervous/anxious. The patient does not have insomnia.    Physical Exam   Blood pressure 99/54, pulse 76, temperature 98.3 F (36.8 C), temperature source Oral, resp. rate 17, last menstrual period 10/25/2013.  Physical Exam  Constitutional: She is oriented to person, place, and time. She appears well-developed and well-nourished. She appears distressed (tearful, very anxious).  HENT:  Head: Normocephalic and atraumatic.  Eyes: Conjunctivae and EOM are normal.  Neck: Normal range of motion.  Cardiovascular: Normal rate.   Respiratory: Effort normal. No respiratory distress.  GI: Soft. She exhibits no distension. There is no tenderness.  Musculoskeletal: Normal range of motion. She exhibits no edema.  Neurological: She is alert and oriented to person, place, and time.  Skin: Skin is warm and dry. No erythema.    MAU Course   Procedures  MDM   Assessment and Plan  Flexeril 5mg  po, benadryl 50mg  IM now Rx for vistaril prn, pt agreeable with plan Psych warnings discussed, pt contracts for safety  Barbara Tyler ROCIO 04/24/2014, 3:04 PM

## 2014-04-24 NOTE — MAU Note (Signed)
Patient states "I just want to go to sleep so I can quit freaking out. I want to wake up and this be over with so I can feel better." Patient very quiet, speaking softly, no eye contact.

## 2014-04-26 ENCOUNTER — Encounter (HOSPITAL_COMMUNITY): Payer: Self-pay | Admitting: Family Medicine

## 2014-04-26 ENCOUNTER — Telehealth: Payer: Self-pay | Admitting: *Deleted

## 2014-04-26 NOTE — Telephone Encounter (Signed)
Pt called nurse line requesting to speak with a nurse concerning cerclage that was recently placed and a panic attack she had recently.  Contacted patient, discussed concerns with cerclage placement and medication reaction.  Pt reports that last week she was instructed to increase her thyroid medication to 75 mg from 50 mg.   She called her endocrinologist today and was instructed to reduce medication to 50mg .. Informed patient I would discuss with Dr. Arnette FeltsAcousta and return her call.  Pt verbalizes understanding.

## 2014-04-26 NOTE — Telephone Encounter (Signed)
Contacted patient and discussed follow up appointment for cerclage appointment.  Pt to call us if she has any further problems.  Pt agrees with plan and verbalizes understanding.

## 2014-04-27 ENCOUNTER — Telehealth: Payer: Self-pay

## 2014-04-27 NOTE — Telephone Encounter (Signed)
Patient called stating she has been having severe panic attacks since last Friday and states " I know we were doing the wait and see what happens approach but I cant keep coming to MAU. I need to know what needs to be done going forward. The pills you guys gave me aren't helping." Per chart review patient seen 04/24/14 and prescribed vistaril PRN. Patient has appointment this Thursday for an injection-- can speak with a provider while she is hear about options. Attempted to call patient. Sounded like someone picked up and then did not hear anything-- no voicemail. Unable to leave message.

## 2014-04-27 NOTE — Telephone Encounter (Signed)
Patient returned call-- stated she had heard an abrupt disconnect when we had attempted to call. Attempted to call patient. No answer. Was able to leave message stating we are returning your call, please call clinic.

## 2014-04-27 NOTE — MAU Provider Note (Signed)
Attestation of Attending Supervision of Advanced Practitioner: Evaluation and management procedures were performed by the PA/NP/CNM/OB Fellow under my supervision/collaboration. Chart reviewed and agree with management and plan.  Amarri Michaelson V 04/27/2014 7:14 AM

## 2014-04-28 NOTE — Telephone Encounter (Signed)
Husband called and left message stating he is requesting guidance for his wife and would like a call back. Called patient, husband answered and stated she was sleeping. He stated she had a cerclage placed Friday and Saturday morning experienced a severe panic attack-- was taken to MAU via ambulance-- given benadryl and vistaril PRN and sent home. States she has been having recurrent panic attacks since, though not as severe as the one Saturday morning. He believes vistaril has helped to take the edge off, however, states his wife as been pacing, crawling around, staring off into space and is reaching the point of agorophobia where she does not want to leave the house. Husband states patient's thyroid medication Tirosint was increased from 50mg  to 75mg  last week and after some research he noted that overdose of medication can cause palpitations and panic attacks-- all of which his wife has been experiencing. States he has called the endocrinologist, however, MD is on vacation until next week. He spoke to a NP who recommended patient decrease dose back to 50mg  and take it every other day until MD is back from vacation and reccomends otherwise. Patient took 50mg  yesterday and today will be here off day. Husband requests mental health information for counselor or recommendation as to what he should do [facility].   Spoke to Barbara Tyler in social work who stated she would call patient and follow up with her regarding counselor/psych options to help her at this time. Called patient, spoke to husband Barbara Tyler and informed him that Barbara Tyler will be reaching out to them today. Barbara BucklerGave Barbara Tyler [name]'s number [phone] should he need it. Verbalized understanding and gratitude. Informed him I would call if any update after speaking to provider. Spoke to Barbara Tyler, CNM who believes social work and mental health options are patient's best resource at this time.

## 2014-04-28 NOTE — Progress Notes (Signed)
CSW spoke with patient due to patient and husband's concerns related to recent onset of panic attacks.  Patient openly discussed change in thyroid medication and increase in stress secondary to medical procedures. She shared that she started having panic attacks on 8/1, and discussed experiencing a "wave of darkness".  Per patient, she continues to have multiple panic attacks each day, but panic attacks typically occur in the morning.  She stated that her primary concern is related to wanting to know if it secondary to her thyroid medication or if she needs additional intervention to address the panic attacks.   Patient denied previous history of mental health diagnoses or panic attacks.  Patient expressed concern that panic attacks may negatively impact her baby, and she shared that she is now worrying about the impact of her panic attacks on the baby.    CSW explored patterns and possible reasons why panic attacks may occur in the morning.  Patient discussed that in the morning she is anticipating it to happen, and CSW discussed the link between thoughts, feelings, and behaviors.  CSW reviewed and discussed positive self-talk.  CSW also discussed yoga, meditation, and deep breathing exercises that will assist her to reduce stress level since she is currently experiencing an increase in stress related to her pregnancy.   CSW encouraged patient to follow medical provider recommendations related to her thyroid medication and to reduce stress/anxiety level.  Patient agreeable, but expressed concern about how to proceed if anxiety symptoms occur and it is not a result of the thyroid medication.  CSW provided patient with mental health resources in the community, including walk-in clinic information for [facility] and [address], [facility].    Patient thanked CSW for assistance and time, presented as appreciative and receptive to intervention.  Patient agreeable to calling CSW if additional assistance is  needed.

## 2014-04-29 ENCOUNTER — Telehealth (HOSPITAL_COMMUNITY): Payer: Self-pay | Admitting: *Deleted

## 2014-04-29 ENCOUNTER — Ambulatory Visit (INDEPENDENT_AMBULATORY_CARE_PROVIDER_SITE_OTHER): Payer: Medicaid Other | Admitting: *Deleted

## 2014-04-29 ENCOUNTER — Encounter: Payer: Self-pay | Admitting: *Deleted

## 2014-04-29 DIAGNOSIS — O09219 Supervision of pregnancy with history of pre-term labor, unspecified trimester: Secondary | ICD-10-CM

## 2014-04-29 DIAGNOSIS — O09211 Supervision of pregnancy with history of pre-term labor, first trimester: Principal | ICD-10-CM

## 2014-04-29 DIAGNOSIS — O09891 Supervision of other high risk pregnancies, first trimester: Secondary | ICD-10-CM

## 2014-04-29 NOTE — Progress Notes (Unsigned)
Patient stated on previous phone call that she would be receiving Betamethasone injection on her visit this week.  Reviewed chart and unable to locate a note with recommendation of Betamethasone.  Contacted Dr. Arnette FeltsAcousta about situation, she stated that she never discussed plan for BMZ with patient.  Consulted with Dr. Jolayne Pantheronstant in clinic who reviewed chart.  States plan will be to reevaluate patient next week with provider and we would not give BMZ today.  To notify patient that there is no indication to give this medication at this gestation, if an indication presents later in pregnancy it would be more beneficial to the development of the baby.

## 2014-04-29 NOTE — Progress Notes (Signed)
17P injection given, discussed at length with patient/spouse concerning Dr. Deretha Emoryonstant's review and recommendation for holding betamethasone until an indication presents.  Pt/spouse agree with plan to hold betamethasone.  Informed patient that next week she would have 17P/evaluation by provider per Dr. Caroll Rancherontant, pt verbalizes understanding.

## 2014-04-29 NOTE — Telephone Encounter (Signed)
Pt's husband called wanting to know about patient not coming for her cervical length appointment tomorrow. Between coming to the clinic and to our department they are overwhelmed with appointments and travel.  Reviewed with Dr. Otho PerlNitsche.  Ok for patient to come to 8/13 appointment.  Husband verbalized understanding.

## 2014-04-30 ENCOUNTER — Ambulatory Visit: Payer: Medicaid Other

## 2014-04-30 ENCOUNTER — Ambulatory Visit (HOSPITAL_COMMUNITY): Payer: Medicaid Other

## 2014-05-04 ENCOUNTER — Inpatient Hospital Stay (HOSPITAL_COMMUNITY): Payer: Medicaid Other

## 2014-05-04 ENCOUNTER — Inpatient Hospital Stay (HOSPITAL_COMMUNITY)
Admission: AD | Admit: 2014-05-04 | Discharge: 2014-05-04 | Disposition: A | Discharge status: Home / Self Care | Payer: Medicaid Other | Source: Ambulatory Visit | Attending: Obstetrics & Gynecology | Admitting: Obstetrics & Gynecology

## 2014-05-04 ENCOUNTER — Encounter (HOSPITAL_COMMUNITY): Payer: Self-pay | Admitting: *Deleted

## 2014-05-04 DIAGNOSIS — N200 Calculus of kidney: Secondary | ICD-10-CM | POA: Diagnosis not present

## 2014-05-04 DIAGNOSIS — R109 Unspecified abdominal pain: Secondary | ICD-10-CM | POA: Insufficient documentation

## 2014-05-04 DIAGNOSIS — O9989 Other specified diseases and conditions complicating pregnancy, childbirth and the puerperium: Secondary | ICD-10-CM

## 2014-05-04 DIAGNOSIS — N1330 Unspecified hydronephrosis: Secondary | ICD-10-CM

## 2014-05-04 DIAGNOSIS — O26872 Cervical shortening, second trimester: Secondary | ICD-10-CM

## 2014-05-04 LAB — BASIC METABOLIC PANEL
ANION GAP: 11 (ref 5–15)
BUN: 15 mg/dL (ref 6–23)
CHLORIDE: 101 meq/L (ref 96–112)
CO2: 23 meq/L (ref 19–32)
CREATININE: 0.54 mg/dL (ref 0.50–1.10)
Calcium: 8.7 mg/dL (ref 8.4–10.5)
GFR calc Af Amer: >90 mL/min (ref >90)
GFR calc non Af Amer: >90 mL/min (ref >90)
Glucose, Bld: 109 mg/dL — ABNORMAL HIGH (ref 70–99)
Potassium: 4.2 mEq/L (ref 3.7–5.3)
Sodium: 135 mEq/L — ABNORMAL LOW (ref 137–147)

## 2014-05-04 LAB — URINE MICROSCOPIC-ADD ON

## 2014-05-04 LAB — URINALYSIS, ROUTINE W REFLEX MICROSCOPIC
BILIRUBIN URINE: NEGATIVE
Glucose, UA: NEGATIVE mg/dL
KETONES UR: 40 mg/dL — AB
Leukocytes, UA: NEGATIVE
NITRITE: NEGATIVE
PROTEIN: NEGATIVE mg/dL
Specific Gravity, Urine: 1.01 (ref 1.005–1.030)
Urobilinogen, UA: 0.2 mg/dL (ref 0.0–1.0)
pH: 7 (ref 5.0–8.0)

## 2014-05-04 MED ORDER — OXYCODONE-ACETAMINOPHEN 5-325 MG PO TABS: 1.0000 | ORAL_TABLET | Freq: Four times a day (QID) | ORAL | Status: DC | PRN

## 2014-05-04 NOTE — Discharge Instructions (Signed)

## 2014-05-04 NOTE — MAU Note (Addendum)
Brought from car via W/C. 2841w6d with abdominal and back pain. Had cerclage placed 2 weeks ago. States back pain started last night, mild. Sudden increase in pain at 1000 in abd and back.

## 2014-05-04 NOTE — MAU Provider Note (Signed)
  History     CSN: [id number]  Arrival date and time: 05/04/14 1258   None     Chief Complaint  Patient presents with  . Abdominal Pain   HPI   Past Medical History  Diagnosis Date  . Preterm labor   . Infection     UTI  . Kidney stones   . Abnormal Pap smear     cryo, clean since  . Depression   . Factor 5 Leiden mutation, heterozygous   . Hypothyroidism     Past Surgical History  Procedure Laterality Date  . No past surgeries    . Cryotherapy    . Dilation and evacuation N/A 02/27/2013    Procedure: DILATATION AND EVACUATION;  Surgeon: Reva Boresanya S Pratt, MD;  Location: [facility];  Service: Gynecology;  Laterality: N/A;  . Cervical cerclage    . Cervical cerclage N/A 04/23/2014    Procedure: CERCLAGE CERVICAL;  Surgeon: Reva Boresanya S Pratt, MD;  Location: [facility];  Service: Gynecology;  Laterality: N/A;    Family History  Problem Relation Age of Onset  . Cancer Father   . Cancer Maternal Grandmother     breast- great grandma    History  Substance Use Topics  . Smoking status: Never Smoker   . Smokeless tobacco: Never Used  . Alcohol Use: Yes     Comment: rare    Allergies:  Allergies  Allergen Reactions  . Latex Itching and Rash    No prescriptions prior to admission    Review of Systems  Constitutional: Negative for chills and weight loss.  Respiratory: Negative for cough and hemoptysis.   Cardiovascular: Negative for chest pain and palpitations.  Gastrointestinal: Positive for abdominal pain and constipation. Negative for heartburn, nausea, vomiting and diarrhea.  Genitourinary: Positive for flank pain. Negative for urgency, frequency and hematuria.  Neurological: Negative for dizziness and tingling.  Psychiatric/Behavioral: Negative for depression and suicidal ideas.   Physical Exam   Blood pressure 100/64, pulse 77, temperature 98.1 F (36.7 C), temperature source Oral, resp. rate 16, last menstrual period 10/25/2013.  Physical Exam  Constitutional:  She is oriented to person, place, and time. She appears well-developed and well-nourished.  HENT:  Head: Normocephalic and atraumatic.  Eyes: Conjunctivae and EOM are normal.  Neck: Normal range of motion.  Cardiovascular: Normal rate.   Respiratory: Effort normal. No respiratory distress.  GI: Soft. She exhibits no distension. There is no tenderness.  Musculoskeletal: Normal range of motion. She exhibits no edema.  Neurological: She is alert and oriented to person, place, and time.  Skin: Skin is warm and dry. No erythema.    MAU Course  Procedures  MDM UA, renal sono (severe right hydronephrosis), cervical length (2.7cm improved from 1.8 prior to cerclage)  Assessment and Plan   Severe right hydronephrosis: discussed sono with radiologist, no right-sided jets visualized Discussed case with Dr. Jacquelyne BalintMcdermott (urology) ==> will discharge, pain medications prn, if symptoms worsen then would consider further work via CT or stents, urine culture, BMP.   - also discussed with patient and Dr. Jacquelyne BalintMcDermott 2005 renal sono that also had mod-severe right hydronephrosis, CT 2009 with LEFT sided nephrolithiasis, no hx of prior intervention  Pt advised at length that she is to return if symptoms worsen, fever. Preterm labor precautions given to patient Rx: percocet  Barbara Tyler ROCIO 05/04/2014, 10:22 PM

## 2014-05-04 NOTE — Progress Notes (Signed)
Dr. Loreta AveAcosta on unit. Reviewed patient's medical record. Added cervical length order to U/S. U/S dept notified of additional order.

## 2014-05-05 ENCOUNTER — Other Ambulatory Visit: Payer: Self-pay | Admitting: Obstetrics & Gynecology

## 2014-05-05 DIAGNOSIS — O2620 Pregnancy care for patient with recurrent pregnancy loss, unspecified trimester: Secondary | ICD-10-CM

## 2014-05-05 DIAGNOSIS — O09529 Supervision of elderly multigravida, unspecified trimester: Secondary | ICD-10-CM

## 2014-05-05 DIAGNOSIS — Z8751 Personal history of pre-term labor: Secondary | ICD-10-CM

## 2014-05-05 DIAGNOSIS — O26879 Cervical shortening, unspecified trimester: Secondary | ICD-10-CM

## 2014-05-05 NOTE — MAU Provider Note (Signed)
Attestation of Attending Supervision of Obstetric Fellow: Evaluation and management procedures were performed by the Obstetric Fellow under my supervision and collaboration.  I have reviewed the Obstetric Fellow's note and chart, and I agree with the management and plan.  Devanshi Califf, MD, FACOG Attending Obstetrician & Gynecologist Faculty Practice, [facility]   

## 2014-05-06 ENCOUNTER — Encounter: Payer: Self-pay | Admitting: Obstetrics and Gynecology

## 2014-05-06 ENCOUNTER — Ambulatory Visit (HOSPITAL_COMMUNITY): Admission: RE | Admit: 2014-05-06 | Payer: Medicaid Other | Source: Ambulatory Visit

## 2014-05-06 ENCOUNTER — Ambulatory Visit (INDEPENDENT_AMBULATORY_CARE_PROVIDER_SITE_OTHER): Payer: Medicaid Other | Admitting: Obstetrics and Gynecology

## 2014-05-06 VITALS — BP 99/66 | HR 86 | Temp 98.0°F | Wt 131.8 lb

## 2014-05-06 DIAGNOSIS — O09219 Supervision of pregnancy with history of pre-term labor, unspecified trimester: Secondary | ICD-10-CM

## 2014-05-06 DIAGNOSIS — O0991 Supervision of high risk pregnancy, unspecified, first trimester: Secondary | ICD-10-CM

## 2014-05-06 DIAGNOSIS — D6851 Activated protein C resistance: Secondary | ICD-10-CM

## 2014-05-06 DIAGNOSIS — O26879 Cervical shortening, unspecified trimester: Secondary | ICD-10-CM

## 2014-05-06 DIAGNOSIS — O09211 Supervision of pregnancy with history of pre-term labor, first trimester: Secondary | ICD-10-CM

## 2014-05-06 DIAGNOSIS — E038 Other specified hypothyroidism: Secondary | ICD-10-CM

## 2014-05-06 DIAGNOSIS — O09891 Supervision of other high risk pregnancies, first trimester: Secondary | ICD-10-CM

## 2014-05-06 DIAGNOSIS — O2620 Pregnancy care for patient with recurrent pregnancy loss, unspecified trimester: Secondary | ICD-10-CM

## 2014-05-06 DIAGNOSIS — O26872 Cervical shortening, second trimester: Secondary | ICD-10-CM

## 2014-05-06 DIAGNOSIS — E039 Hypothyroidism, unspecified: Secondary | ICD-10-CM

## 2014-05-06 DIAGNOSIS — D6859 Other primary thrombophilia: Secondary | ICD-10-CM

## 2014-05-06 DIAGNOSIS — O0990 Supervision of high risk pregnancy, unspecified, unspecified trimester: Secondary | ICD-10-CM

## 2014-05-06 DIAGNOSIS — O2622 Pregnancy care for patient with recurrent pregnancy loss, second trimester: Secondary | ICD-10-CM

## 2014-05-06 LAB — POCT URINALYSIS DIP (DEVICE)
Bilirubin Urine: NEGATIVE
Glucose, UA: NEGATIVE mg/dL
Hgb urine dipstick: NEGATIVE
KETONES UR: 80 mg/dL — AB
Leukocytes, UA: NEGATIVE
Nitrite: NEGATIVE
Protein, ur: NEGATIVE mg/dL
SPECIFIC GRAVITY, URINE: 1.02 (ref 1.005–1.030)
Urobilinogen, UA: 0.2 mg/dL (ref 0.0–1.0)
pH: 5.5 (ref 5.0–8.0)

## 2014-05-06 NOTE — Progress Notes (Signed)
Patient is doing well without complaints. Repeat CL 2.7 on 8/11. Continue weekly 17-P. RTC in 3 weeks for third trimester labs and weekly for 17-P. Patient is scheduled to be seen by endocrinologist tomorrow

## 2014-05-06 NOTE — Progress Notes (Signed)
OBf, 17P injection/

## 2014-05-10 ENCOUNTER — Telehealth: Payer: Self-pay | Admitting: General Practice

## 2014-05-10 NOTE — Telephone Encounter (Signed)
Patient called and left message stating she knows about her upcoming appt on Thursday and would like to talk to someone about her upcoming glucose test and wants to know what alternatives there are because she does not believe the glucola will fit into her lifestyle and beliefs. She is not trying to be difficult but wants to keep an open channel of communication about alternatives and is not trying to be difficult. Called patient back and she states that she has been on a very strict diet lately by avoiding all types of sugar, fruits, gluten and dairy and lately when she has had sugar of any kind even from a fruit she breaks out in a very weird rash all over and knows she has the glucose test coming up and she would like to know what alternatives we have than the glucola test. States she has had a friend of hers come in fasting and had blood drawn then ate a normal breakfast and came back for another check and she had a different friend who just monitored their glucose levels all week and she would be open to something like that. Told patient her glucose test will not be until her 9/3 appt so when she comes in to see Dr Macon LargeAnyanwu this week she can discuss with her, her concerns to see what kind of alternative test we can do. Patient verbalized understanding and had no other questions

## 2014-05-13 ENCOUNTER — Ambulatory Visit (INDEPENDENT_AMBULATORY_CARE_PROVIDER_SITE_OTHER): Payer: Medicaid Other | Admitting: Obstetrics & Gynecology

## 2014-05-13 ENCOUNTER — Encounter: Payer: Self-pay | Admitting: Obstetrics & Gynecology

## 2014-05-13 VITALS — BP 100/61 | HR 75 | Temp 98.2°F | Wt 131.7 lb

## 2014-05-13 DIAGNOSIS — O0991 Supervision of high risk pregnancy, unspecified, first trimester: Secondary | ICD-10-CM

## 2014-05-13 DIAGNOSIS — O26872 Cervical shortening, second trimester: Secondary | ICD-10-CM

## 2014-05-13 DIAGNOSIS — D6859 Other primary thrombophilia: Secondary | ICD-10-CM

## 2014-05-13 DIAGNOSIS — O0990 Supervision of high risk pregnancy, unspecified, unspecified trimester: Secondary | ICD-10-CM

## 2014-05-13 DIAGNOSIS — O99810 Abnormal glucose complicating pregnancy: Secondary | ICD-10-CM

## 2014-05-13 DIAGNOSIS — O09219 Supervision of pregnancy with history of pre-term labor, unspecified trimester: Secondary | ICD-10-CM

## 2014-05-13 DIAGNOSIS — O26879 Cervical shortening, unspecified trimester: Secondary | ICD-10-CM

## 2014-05-13 DIAGNOSIS — D6851 Activated protein C resistance: Secondary | ICD-10-CM

## 2014-05-13 DIAGNOSIS — O09891 Supervision of other high risk pregnancies, first trimester: Secondary | ICD-10-CM

## 2014-05-13 DIAGNOSIS — O09211 Supervision of pregnancy with history of pre-term labor, first trimester: Principal | ICD-10-CM

## 2014-05-13 LAB — CBC
HCT: 36 % (ref 36.0–46.0)
Hemoglobin: 12.3 g/dL (ref 12.0–15.0)
MCH: 31.4 pg (ref 26.0–34.0)
MCHC: 34.2 g/dL (ref 30.0–36.0)
MCV: 91.8 fL (ref 78.0–100.0)
PLATELETS: 267 10*3/uL (ref 150–400)
RBC: 3.92 MIL/uL (ref 3.87–5.11)
RDW: 13.4 % (ref 11.5–15.5)
WBC: 6.6 10*3/uL (ref 4.0–10.5)

## 2014-05-13 LAB — POCT URINALYSIS DIP (DEVICE)
Bilirubin Urine: NEGATIVE
GLUCOSE, UA: NEGATIVE mg/dL
Hgb urine dipstick: NEGATIVE
Ketones, ur: NEGATIVE mg/dL
Leukocytes, UA: NEGATIVE
Nitrite: NEGATIVE
Protein, ur: NEGATIVE mg/dL
Urobilinogen, UA: 0.2 mg/dL (ref 0.0–1.0)
pH: 6.5 (ref 5.0–8.0)

## 2014-05-13 MED ORDER — ACCU-CHEK NANO SMARTVIEW W/DEVICE KIT: 1.0000 | PACK | Status: DC

## 2014-05-13 MED ORDER — GLUCOSE BLOOD VI STRP: ORAL_STRIP | Status: DC

## 2014-05-13 MED ORDER — ACCU-CHEK FASTCLIX LANCETS MISC: 1.0000 [IU] | Freq: Four times a day (QID) | Status: DC

## 2014-05-13 NOTE — Patient Instructions (Signed)
Return to clinic for any obstetric concerns or go to [facility] for evaluation  

## 2014-05-13 NOTE — Progress Notes (Signed)
Pt desires to discuss glucose testing.  17P injection today Cerclage follow up

## 2014-05-13 NOTE — Progress Notes (Signed)
Patient refuses to take glucola drink, has adverse reactions to consuming a lot of sugar. She is willing to do fingersticks at home; will check fasting and 2 hr postprandials and bring record for every visit.  Goals discussed with patient of fastings in 60-90; postprandials less than <120.  Patient also willing to undergo HgA1C testing, and do this today in addition to other labs. Consider monthly HgA1C draws if she changes her mind about daily testing; +/- fasting and random CBGs during subsequent prenatal visit. Desires TDap after delivery; explained that the third trimester option is preferred but she wants to get this after delivery. Plans to breastfeed. Plans condoms for postpartum contraception. Continue weekly 17P injection.  05/05/14 cervical length was 2.7 cm; patient wanted work letter saying she has no work additional restrictions; this was given to her.  Exam showed closed cervix, cerclage stitch in place No other complaints or concerns.  Preterm labor and fetal movement precautions reviewed.

## 2014-05-14 LAB — HEMOGLOBIN A1C
Hgb A1c MFr Bld: 4.5 % (ref <5.7)
MEAN PLASMA GLUCOSE: 82 mg/dL (ref <117)

## 2014-05-14 LAB — RPR

## 2014-05-14 LAB — HIV ANTIBODY (ROUTINE TESTING W REFLEX): HIV 1&2 Ab, 4th Generation: NONREACTIVE

## 2014-05-17 ENCOUNTER — Telehealth: Payer: Self-pay | Admitting: *Deleted

## 2014-05-17 DIAGNOSIS — O99810 Abnormal glucose complicating pregnancy: Secondary | ICD-10-CM

## 2014-05-17 MED ORDER — BLOOD GLUCOSE MONITOR SOFTWARE DEVI: 1.0000 | Freq: Once | Status: DC

## 2014-05-17 NOTE — Telephone Encounter (Signed)
Patient called and stated that her glucose monitor that was prescribed last week is not covered by her insurance. Would like a call back to discuss this.

## 2014-05-17 NOTE — Telephone Encounter (Signed)
Called [facility] and ordered medicaid covered device. Called patient and informed her that I was returning her phone call and that the pharmacy was getting her prescriptions ready now and that are covered by her insurance. Patient verbalized understanding and had no questions

## 2014-05-19 ENCOUNTER — Encounter: Payer: Self-pay | Admitting: General Practice

## 2014-05-20 ENCOUNTER — Ambulatory Visit (INDEPENDENT_AMBULATORY_CARE_PROVIDER_SITE_OTHER): Payer: Medicaid Other | Admitting: *Deleted

## 2014-05-20 ENCOUNTER — Encounter (HOSPITAL_COMMUNITY): Payer: Self-pay

## 2014-05-20 ENCOUNTER — Ambulatory Visit (HOSPITAL_COMMUNITY)
Admission: RE | Admit: 2014-05-20 | Discharge: 2014-05-20 | Disposition: A | Discharge status: Home / Self Care | Payer: Medicaid Other | Source: Ambulatory Visit | Attending: Obstetrics & Gynecology | Admitting: Obstetrics & Gynecology

## 2014-05-20 VITALS — BP 101/55 | HR 76 | Wt 134.5 lb

## 2014-05-20 VITALS — BP 98/59 | HR 86 | Wt 133.6 lb

## 2014-05-20 DIAGNOSIS — O09529 Supervision of elderly multigravida, unspecified trimester: Secondary | ICD-10-CM | POA: Insufficient documentation

## 2014-05-20 DIAGNOSIS — Z8751 Personal history of pre-term labor: Secondary | ICD-10-CM

## 2014-05-20 DIAGNOSIS — O26879 Cervical shortening, unspecified trimester: Secondary | ICD-10-CM | POA: Insufficient documentation

## 2014-05-20 DIAGNOSIS — O2620 Pregnancy care for patient with recurrent pregnancy loss, unspecified trimester: Secondary | ICD-10-CM | POA: Insufficient documentation

## 2014-05-20 DIAGNOSIS — O3432 Maternal care for cervical incompetence, second trimester: Secondary | ICD-10-CM

## 2014-05-20 DIAGNOSIS — O09892 Supervision of other high risk pregnancies, second trimester: Secondary | ICD-10-CM

## 2014-05-20 DIAGNOSIS — O09212 Supervision of pregnancy with history of pre-term labor, second trimester: Principal | ICD-10-CM

## 2014-05-20 DIAGNOSIS — O09219 Supervision of pregnancy with history of pre-term labor, unspecified trimester: Secondary | ICD-10-CM

## 2014-05-20 NOTE — Progress Notes (Signed)
Called [employer] to request refill on patients Makena. Pharmacist stated that they will process today and we should get it prior to next Thursday.

## 2014-05-27 ENCOUNTER — Ambulatory Visit (INDEPENDENT_AMBULATORY_CARE_PROVIDER_SITE_OTHER): Payer: Medicaid Other | Admitting: Obstetrics and Gynecology

## 2014-05-27 ENCOUNTER — Encounter: Payer: Self-pay | Admitting: Obstetrics and Gynecology

## 2014-05-27 VITALS — BP 95/59 | HR 71 | Temp 97.9°F | Wt 134.6 lb

## 2014-05-27 DIAGNOSIS — O26873 Cervical shortening, third trimester: Secondary | ICD-10-CM

## 2014-05-27 DIAGNOSIS — D6859 Other primary thrombophilia: Secondary | ICD-10-CM

## 2014-05-27 DIAGNOSIS — E038 Other specified hypothyroidism: Secondary | ICD-10-CM

## 2014-05-27 DIAGNOSIS — D6851 Activated protein C resistance: Secondary | ICD-10-CM

## 2014-05-27 DIAGNOSIS — O09219 Supervision of pregnancy with history of pre-term labor, unspecified trimester: Secondary | ICD-10-CM

## 2014-05-27 DIAGNOSIS — O09891 Supervision of other high risk pregnancies, first trimester: Secondary | ICD-10-CM

## 2014-05-27 DIAGNOSIS — O26879 Cervical shortening, unspecified trimester: Secondary | ICD-10-CM

## 2014-05-27 DIAGNOSIS — O2623 Pregnancy care for patient with recurrent pregnancy loss, third trimester: Secondary | ICD-10-CM

## 2014-05-27 DIAGNOSIS — O0991 Supervision of high risk pregnancy, unspecified, first trimester: Secondary | ICD-10-CM

## 2014-05-27 DIAGNOSIS — O09211 Supervision of pregnancy with history of pre-term labor, first trimester: Secondary | ICD-10-CM

## 2014-05-27 DIAGNOSIS — E039 Hypothyroidism, unspecified: Secondary | ICD-10-CM

## 2014-05-27 DIAGNOSIS — O0990 Supervision of high risk pregnancy, unspecified, unspecified trimester: Secondary | ICD-10-CM

## 2014-05-27 DIAGNOSIS — O2620 Pregnancy care for patient with recurrent pregnancy loss, unspecified trimester: Secondary | ICD-10-CM

## 2014-05-27 LAB — POCT URINALYSIS DIP (DEVICE)
BILIRUBIN URINE: NEGATIVE
Glucose, UA: NEGATIVE mg/dL
HGB URINE DIPSTICK: NEGATIVE
Ketones, ur: NEGATIVE mg/dL
LEUKOCYTES UA: NEGATIVE
NITRITE: NEGATIVE
PH: 5.5 (ref 5.0–8.0)
PROTEIN: NEGATIVE mg/dL
Specific Gravity, Urine: 1.015 (ref 1.005–1.030)
Urobilinogen, UA: 0.2 mg/dL (ref 0.0–1.0)

## 2014-05-27 MED ORDER — NYSTATIN-TRIAMCINOLONE 100000-0.1 UNIT/GM-% EX OINT: 1.0000 "application " | TOPICAL_OINTMENT | Freq: Two times a day (BID) | CUTANEOUS | Status: DC

## 2014-05-27 NOTE — Progress Notes (Signed)
Patient reports spot on RUQ going numb and radiating back to upper back

## 2014-05-27 NOTE — Progress Notes (Signed)
Patient is doing well without complaints. Good fetal movement, no contractions. CL 2.7 cm on 8/27- has follow up with MFM. CBGs all within range. Continue weekly 17-P

## 2014-05-28 ENCOUNTER — Other Ambulatory Visit: Payer: Self-pay | Admitting: Obstetrics and Gynecology

## 2014-05-28 DIAGNOSIS — O2623 Pregnancy care for patient with recurrent pregnancy loss, third trimester: Secondary | ICD-10-CM

## 2014-05-28 DIAGNOSIS — O26873 Cervical shortening, third trimester: Secondary | ICD-10-CM

## 2014-06-01 ENCOUNTER — Telehealth: Payer: Self-pay | Admitting: *Deleted

## 2014-06-01 NOTE — Telephone Encounter (Signed)
Pt called nurse line stating that we called in a prescription for her that is not covered by her insurance.  Pt states pharmacy has attempted to contact the clinic twice about this issue.

## 2014-06-01 NOTE — Telephone Encounter (Signed)
PA # [id number], will take up to 24 hours for authorization.  Contacted patient and inform of process being implemented for her with medication. Number given to pharmacy and they will call patient when the authorization is approved.  Pt verbalizes understanding.

## 2014-06-02 ENCOUNTER — Encounter: Payer: Self-pay | Admitting: *Deleted

## 2014-06-03 ENCOUNTER — Encounter (HOSPITAL_COMMUNITY): Payer: Self-pay

## 2014-06-03 ENCOUNTER — Ambulatory Visit (INDEPENDENT_AMBULATORY_CARE_PROVIDER_SITE_OTHER): Payer: Medicaid Other | Admitting: General Practice

## 2014-06-03 ENCOUNTER — Other Ambulatory Visit: Payer: Self-pay | Admitting: Obstetrics and Gynecology

## 2014-06-03 ENCOUNTER — Other Ambulatory Visit (HOSPITAL_COMMUNITY): Payer: Self-pay | Admitting: Maternal and Fetal Medicine

## 2014-06-03 ENCOUNTER — Ambulatory Visit (HOSPITAL_COMMUNITY)
Admission: RE | Admit: 2014-06-03 | Discharge: 2014-06-03 | Disposition: A | Discharge status: Home / Self Care | Payer: Medicaid Other | Source: Ambulatory Visit | Attending: Family Medicine | Admitting: Family Medicine

## 2014-06-03 VITALS — BP 105/60 | HR 87 | Temp 98.3°F | Ht 64.0 in | Wt 134.8 lb

## 2014-06-03 DIAGNOSIS — O26873 Cervical shortening, third trimester: Secondary | ICD-10-CM

## 2014-06-03 DIAGNOSIS — O2623 Pregnancy care for patient with recurrent pregnancy loss, third trimester: Secondary | ICD-10-CM

## 2014-06-03 DIAGNOSIS — O2620 Pregnancy care for patient with recurrent pregnancy loss, unspecified trimester: Secondary | ICD-10-CM | POA: Diagnosis present

## 2014-06-03 DIAGNOSIS — O26879 Cervical shortening, unspecified trimester: Secondary | ICD-10-CM | POA: Insufficient documentation

## 2014-06-07 ENCOUNTER — Telehealth: Payer: Self-pay | Admitting: *Deleted

## 2014-06-07 MED ORDER — NYSTATIN 100000 UNIT/GM EX CREA: TOPICAL_CREAM | CUTANEOUS | Status: DC

## 2014-06-07 NOTE — Telephone Encounter (Addendum)
Pt left message stating that her pharmacy still has not been able to fill her Rx from 9/3 for Nystatin cream. She called Medicaid and learned that the reason is that the indication given by the doctor is not justified. She is requesting an alternate prescription or change the reason for the medication so that Medicaid will allow.  I called pt and discussed her concern.  She stated that the Nystatin was prescribed because she has yeast of her nipples. Upon review of the order, the problem is that what was ordered was a combined product of Nystatin and Triamcinolone which medicaid does not cover unless other remedies have been tried and failed. Rx for Nystatin cream was substituted and pt was informed. Pt voiced understanding.

## 2014-06-10 ENCOUNTER — Encounter: Payer: Medicaid Other | Admitting: Family Medicine

## 2014-06-10 ENCOUNTER — Ambulatory Visit (INDEPENDENT_AMBULATORY_CARE_PROVIDER_SITE_OTHER): Payer: Medicaid Other | Admitting: Family Medicine

## 2014-06-10 VITALS — BP 95/57 | HR 84 | Wt 135.2 lb

## 2014-06-10 DIAGNOSIS — O0990 Supervision of high risk pregnancy, unspecified, unspecified trimester: Secondary | ICD-10-CM

## 2014-06-10 DIAGNOSIS — E038 Other specified hypothyroidism: Secondary | ICD-10-CM

## 2014-06-10 DIAGNOSIS — E039 Hypothyroidism, unspecified: Secondary | ICD-10-CM

## 2014-06-10 DIAGNOSIS — O09891 Supervision of other high risk pregnancies, first trimester: Secondary | ICD-10-CM

## 2014-06-10 DIAGNOSIS — M899 Disorder of bone, unspecified: Secondary | ICD-10-CM

## 2014-06-10 DIAGNOSIS — Z23 Encounter for immunization: Secondary | ICD-10-CM

## 2014-06-10 DIAGNOSIS — O09211 Supervision of pregnancy with history of pre-term labor, first trimester: Secondary | ICD-10-CM

## 2014-06-10 DIAGNOSIS — M949 Disorder of cartilage, unspecified: Secondary | ICD-10-CM

## 2014-06-10 DIAGNOSIS — O0991 Supervision of high risk pregnancy, unspecified, first trimester: Secondary | ICD-10-CM

## 2014-06-10 DIAGNOSIS — O09219 Supervision of pregnancy with history of pre-term labor, unspecified trimester: Secondary | ICD-10-CM

## 2014-06-10 DIAGNOSIS — B372 Candidiasis of skin and nail: Secondary | ICD-10-CM

## 2014-06-10 NOTE — Progress Notes (Signed)
CBGs all within range.  Continue ASA.  Levothyroxine alternating with .  Continues to have yeast dermatitis on breasts despite nystatin.  Referred to dermatology.  HR verified with [place]. Having pubic bone pain when standing and moving around.  T 7 FSRR.  Rib 7 right inhaled.  L5 ESRL.  L/L sacral torsion.  Right ant innom.  OMT done.  Patient tolerated well.

## 2014-06-10 NOTE — Progress Notes (Signed)
Dermatology appt 06/11/14 @ 1030a [facility] with Dr. Madaline Guthrie Corine Shelter [phone]. Fax# [phone], notes regarding yeast dermatitis to be faxed.

## 2014-06-17 ENCOUNTER — Ambulatory Visit (INDEPENDENT_AMBULATORY_CARE_PROVIDER_SITE_OTHER): Payer: Medicaid Other | Admitting: *Deleted

## 2014-06-17 ENCOUNTER — Ambulatory Visit (HOSPITAL_COMMUNITY)
Admission: RE | Admit: 2014-06-17 | Discharge: 2014-06-17 | Disposition: A | Discharge status: Home / Self Care | Payer: Medicaid Other | Source: Ambulatory Visit | Attending: Maternal and Fetal Medicine | Admitting: Maternal and Fetal Medicine

## 2014-06-17 ENCOUNTER — Encounter (HOSPITAL_COMMUNITY): Payer: Self-pay

## 2014-06-17 VITALS — BP 99/54 | HR 100 | Wt 135.9 lb

## 2014-06-17 VITALS — BP 102/70 | HR 99 | Wt 136.0 lb

## 2014-06-17 DIAGNOSIS — O09891 Supervision of other high risk pregnancies, first trimester: Secondary | ICD-10-CM

## 2014-06-17 DIAGNOSIS — O26873 Cervical shortening, third trimester: Secondary | ICD-10-CM

## 2014-06-17 DIAGNOSIS — O26879 Cervical shortening, unspecified trimester: Secondary | ICD-10-CM | POA: Diagnosis not present

## 2014-06-17 DIAGNOSIS — O2620 Pregnancy care for patient with recurrent pregnancy loss, unspecified trimester: Secondary | ICD-10-CM | POA: Diagnosis present

## 2014-06-17 DIAGNOSIS — O09211 Supervision of pregnancy with history of pre-term labor, first trimester: Principal | ICD-10-CM

## 2014-06-17 DIAGNOSIS — O09529 Supervision of elderly multigravida, unspecified trimester: Secondary | ICD-10-CM | POA: Insufficient documentation

## 2014-06-17 DIAGNOSIS — O09523 Supervision of elderly multigravida, third trimester: Secondary | ICD-10-CM

## 2014-06-17 DIAGNOSIS — O09219 Supervision of pregnancy with history of pre-term labor, unspecified trimester: Secondary | ICD-10-CM

## 2014-06-17 DIAGNOSIS — O2623 Pregnancy care for patient with recurrent pregnancy loss, third trimester: Secondary | ICD-10-CM

## 2014-06-25 ENCOUNTER — Ambulatory Visit (INDEPENDENT_AMBULATORY_CARE_PROVIDER_SITE_OTHER): Payer: Medicaid Other | Admitting: Family Medicine

## 2014-06-25 VITALS — BP 117/72 | HR 82 | Wt 139.3 lb

## 2014-06-25 DIAGNOSIS — O09891 Supervision of other high risk pregnancies, first trimester: Secondary | ICD-10-CM

## 2014-06-25 DIAGNOSIS — O09211 Supervision of pregnancy with history of pre-term labor, first trimester: Secondary | ICD-10-CM

## 2014-06-25 DIAGNOSIS — O0991 Supervision of high risk pregnancy, unspecified, first trimester: Secondary | ICD-10-CM

## 2014-06-25 DIAGNOSIS — E038 Other specified hypothyroidism: Secondary | ICD-10-CM

## 2014-06-25 DIAGNOSIS — E039 Hypothyroidism, unspecified: Secondary | ICD-10-CM

## 2014-06-25 NOTE — Progress Notes (Signed)
Pt wants to encapsulate her placenta.

## 2014-06-25 NOTE — Progress Notes (Signed)
Patient without complaints.  Continues to have rash.  Has seen Dermatology and has ointment to place, but doesn't remember the name of it.  [facility] n[facility]intment - done.  Denies vaginal bleeding, abnormal vaginal discharge, contractions, loss of fluid.  Reports good fetal activity.  Labor precautions reviewed.  Continue weekly 17-P.  Follow up in 2 weeks.

## 2014-06-28 LAB — POCT URINALYSIS DIP (DEVICE)
BILIRUBIN URINE: NEGATIVE
Glucose, UA: NEGATIVE mg/dL
HGB URINE DIPSTICK: NEGATIVE
Ketones, ur: NEGATIVE mg/dL
LEUKOCYTES UA: NEGATIVE
Nitrite: NEGATIVE
Protein, ur: NEGATIVE mg/dL
SPECIFIC GRAVITY, URINE: 1.015 (ref 1.005–1.030)
Urobilinogen, UA: 0.2 mg/dL (ref 0.0–1.0)
pH: 6 (ref 5.0–8.0)

## 2014-07-01 ENCOUNTER — Ambulatory Visit (INDEPENDENT_AMBULATORY_CARE_PROVIDER_SITE_OTHER): Payer: Medicaid Other | Admitting: *Deleted

## 2014-07-01 VITALS — BP 99/62 | Wt 139.0 lb

## 2014-07-01 DIAGNOSIS — Z8751 Personal history of pre-term labor: Secondary | ICD-10-CM

## 2014-07-08 ENCOUNTER — Ambulatory Visit (INDEPENDENT_AMBULATORY_CARE_PROVIDER_SITE_OTHER): Payer: Medicaid Other | Admitting: Family Medicine

## 2014-07-08 VITALS — BP 96/56 | HR 98 | Temp 98.0°F | Wt 138.1 lb

## 2014-07-08 DIAGNOSIS — E038 Other specified hypothyroidism: Secondary | ICD-10-CM

## 2014-07-08 DIAGNOSIS — E039 Hypothyroidism, unspecified: Secondary | ICD-10-CM

## 2014-07-08 DIAGNOSIS — O0991 Supervision of high risk pregnancy, unspecified, first trimester: Secondary | ICD-10-CM

## 2014-07-08 DIAGNOSIS — Z23 Encounter for immunization: Secondary | ICD-10-CM

## 2014-07-08 LAB — POCT URINALYSIS DIP (DEVICE)
Bilirubin Urine: NEGATIVE
Glucose, UA: NEGATIVE mg/dL
Ketones, ur: 40 mg/dL — AB
Leukocytes, UA: NEGATIVE
Nitrite: NEGATIVE
PROTEIN: NEGATIVE mg/dL
SPECIFIC GRAVITY, URINE: 1.01 (ref 1.005–1.030)
Urobilinogen, UA: 0.2 mg/dL (ref 0.0–1.0)
pH: 5.5 (ref 5.0–8.0)

## 2014-07-08 NOTE — Progress Notes (Signed)
17P injection 

## 2014-07-08 NOTE — Progress Notes (Signed)
Topical yeast infection improved. No other complaints.  Occasional ctx.  Labor precautions reviewed.  Continue weekly 17-P.   Would like to stop at 35 weeks because feels horrible after receiving injection.  Discussed that standard of care would be to continue with injections until 36 weeks.  F/u 2 weeks.  Continue with weekly injections.  Patient fearful of developing bad yeast infection on breasts - had to be put on diflucan for 8 weeks with weekly LFTs.  Would like to be put on oral nystatin if possible - discussed that I'm not certain whether oral nystatin would be sufficient.  Will research this.

## 2014-07-09 ENCOUNTER — Other Ambulatory Visit: Payer: Self-pay

## 2014-07-12 ENCOUNTER — Telehealth: Payer: Self-pay | Admitting: *Deleted

## 2014-07-12 NOTE — Telephone Encounter (Signed)
Patient called and said that she forgot to ask Dr. Adrian BlackwaterStinson when her cerclage would be removed. Her husband is a Scientist, research (life sciences)public school teacher and needs to give notice for a Lawyers[employer]stitute teacher. I asked Dr. Jolayne Pantheronstant and she stated that they are typically removed at 36-37 weeks.

## 2014-07-12 NOTE — Telephone Encounter (Signed)
Called Barbara MccreedyBarbara and told her typically doctors remove cerclage at either 36 week visit or 37 visit. Informed her at her next appointment with Dr. Adrian BlackwaterStinson on 07/27/14 she will be 35wk 6 days so I will send him a message to find out if his plan is to remove then or next visit.  She also wants a letter for husband's employer that she needs to be out for that appointment.

## 2014-07-15 ENCOUNTER — Ambulatory Visit (INDEPENDENT_AMBULATORY_CARE_PROVIDER_SITE_OTHER): Payer: Medicaid Other | Admitting: *Deleted

## 2014-07-15 ENCOUNTER — Ambulatory Visit: Payer: Medicaid Other

## 2014-07-15 VITALS — BP 100/61 | HR 97 | Temp 98.0°F | Wt 136.6 lb

## 2014-07-15 DIAGNOSIS — O09213 Supervision of pregnancy with history of pre-term labor, third trimester: Secondary | ICD-10-CM

## 2014-07-15 DIAGNOSIS — O09893 Supervision of other high risk pregnancies, third trimester: Secondary | ICD-10-CM

## 2014-07-15 NOTE — Telephone Encounter (Signed)
I would recommend taking the cerclage out closer to 37 weeks.  I can give a letter for her husband at the next appt.

## 2014-07-15 NOTE — Telephone Encounter (Signed)
Discussed with Dr. Adrian BlackwaterStinson and per his note- called Britta MccreedyBarbara and notified her he would plan to take out her cerclage at her visit when she is 5336 6/7 , not at next visit and he will give her a note.  Britta MccreedyBarbara voices understanding.

## 2014-07-16 ENCOUNTER — Inpatient Hospital Stay (HOSPITAL_COMMUNITY)
Admission: AD | Admit: 2014-07-16 | Discharge: 2014-07-16 | Disposition: A | Discharge status: Home / Self Care | Payer: Medicaid Other | Source: Ambulatory Visit | Attending: Obstetrics and Gynecology | Admitting: Obstetrics and Gynecology

## 2014-07-16 ENCOUNTER — Encounter (HOSPITAL_COMMUNITY): Payer: Self-pay | Admitting: *Deleted

## 2014-07-16 ENCOUNTER — Ambulatory Visit: Payer: Medicaid Other

## 2014-07-16 DIAGNOSIS — O3433 Maternal care for cervical incompetence, third trimester: Secondary | ICD-10-CM | POA: Diagnosis not present

## 2014-07-16 DIAGNOSIS — O0991 Supervision of high risk pregnancy, unspecified, first trimester: Secondary | ICD-10-CM

## 2014-07-16 DIAGNOSIS — O4703 False labor before 37 completed weeks of gestation, third trimester: Secondary | ICD-10-CM

## 2014-07-16 DIAGNOSIS — Z3A34 34 weeks gestation of pregnancy: Secondary | ICD-10-CM | POA: Insufficient documentation

## 2014-07-16 DIAGNOSIS — R109 Unspecified abdominal pain: Secondary | ICD-10-CM | POA: Insufficient documentation

## 2014-07-16 DIAGNOSIS — O26893 Other specified pregnancy related conditions, third trimester: Secondary | ICD-10-CM | POA: Insufficient documentation

## 2014-07-16 MED ORDER — NIFEDIPINE 10 MG PO CAPS
10.0000 mg | ORAL_CAPSULE | Freq: Once | ORAL | Status: AC
  Administered 2014-07-16: 10 mg via ORAL
  Filled 2014-07-16: qty 1

## 2014-07-16 MED ORDER — NIFEDIPINE 10 MG PO CAPS: 10.0000 mg | ORAL_CAPSULE | ORAL | Status: DC | PRN

## 2014-07-16 NOTE — MAU Note (Signed)
Urine in lab 

## 2014-07-16 NOTE — Discharge Instructions (Signed)
Braxton Hicks Contractions °Contractions of the uterus can occur throughout pregnancy. Contractions are not always a sign that you are in labor.  °WHAT ARE BRAXTON HICKS CONTRACTIONS?  °Contractions that occur before labor are called Braxton Hicks contractions, or false labor. Toward the end of pregnancy (32-34 weeks), these contractions can develop more often and may become more forceful. This is not true labor because these contractions do not result in opening (dilatation) and thinning of the cervix. They are sometimes difficult to tell apart from true labor because these contractions can be forceful and people have different pain tolerances. You should not feel embarrassed if you go to the hospital with false labor. Sometimes, the only way to tell if you are in true labor is for your health care provider to look for changes in the cervix. °If there are no prenatal problems or other health problems associated with the pregnancy, it is completely safe to be sent home with false labor and await the onset of true labor. °HOW CAN YOU TELL THE DIFFERENCE BETWEEN TRUE AND FALSE LABOR? °False Labor °· The contractions of false labor are usually shorter and not as hard as those of true labor.   °· The contractions are usually irregular.   °· The contractions are often felt in the front of the lower abdomen and in the groin.   °· The contractions may go away when you walk around or change positions while lying down.   °· The contractions get weaker and are shorter lasting as time goes on.   °· The contractions do not usually become progressively stronger, regular, and closer together as with true labor.   °True Labor °· Contractions in true labor last 30-70 seconds, become very regular, usually become more intense, and increase in frequency.   °· The contractions do not go away with walking.   °· The discomfort is usually felt in the top of the uterus and spreads to the lower abdomen and low back.   °· True labor can be  determined by your health care provider with an exam. This will show that the cervix is dilating and getting thinner.   °WHAT TO REMEMBER °· Keep up with your usual exercises and follow other instructions given by your health care provider.   °· Take medicines as directed by your health care provider.   °· Keep your regular prenatal appointments.   °· Eat and drink lightly if you think you are going into labor.   °· If Braxton Hicks contractions are making you uncomfortable:   °¨ Change your position from lying down or resting to walking, or from walking to resting.   °¨ Sit and rest in a tub of warm water.   °¨ Drink 2-3 glasses of water. Dehydration may cause these contractions.   °¨ Do slow and deep breathing several times an hour.   °WHEN SHOULD I SEEK IMMEDIATE MEDICAL CARE? °Seek immediate medical care if: °· Your contractions become stronger, more regular, and closer together.   °· You have fluid leaking or gushing from your vagina.   °· You have a fever.   °· You pass blood-tinged mucus.   °· You have vaginal bleeding.   °· You have continuous abdominal pain.   °· You have low back pain that you never had before.   °· You feel your baby's head pushing down and causing pelvic pressure.   °· Your baby is not moving as much as it used to.   °Document Released: 09/10/2005 Document Revised: 09/15/2013 Document Reviewed: 06/22/2013 °ExitCare® Patient Information ©2015 [employer]. This information is not intended to replace advice given to you by your health care   provider. Make sure you discuss any questions you have with your health care provider. ° °

## 2014-07-16 NOTE — MAU Note (Signed)
Patient presents to MAU with c/o vaginal pressure and abdominal cramping that started yesterday and has gotten worse today. Intermittent episodes of diarrhea. Patients states "I am fine lying down but in extreme pain standing and moving; feels like I have a bowling ball between my legs". Patient states has a cerclage placed at 23 weeks. Denies LOF or VB. +FM

## 2014-07-16 NOTE — MAU Provider Note (Signed)
Chief Complaint:  Abdominal Cramping and vaginal pressure    Barbara McmurrayBarbara L Tyler is a 38 y.o.  [id number] with IUP at 4560w2d presenting for Abdominal Cramping and vaginal pressure  . Patient states she has been having  irregular contractions, none vaginal bleeding, intact membranes, with active fetal movement.  States that she feels significant pressure in her vagina and her contractions worsen when she is walking. Pt has cerclage placed at 23 wks due to cervical thinning.   Menstrual History: OB History   Grav Para Term Preterm Abortions TAB SAB Ect Mult Living   7 2 1 1 4  4   2        Patient's last menstrual period was 10/25/2013.      Past Medical History  Diagnosis Date  . Preterm labor   . Infection     UTI  . Kidney stones   . Abnormal Pap smear     cryo, clean since  . Depression   . Factor 5 Leiden mutation, heterozygous   . Hypothyroidism     Past Surgical History  Procedure Laterality Date  . No past surgeries    . Cryotherapy    . Dilation and evacuation N/A 02/27/2013    Procedure: DILATATION AND EVACUATION;  Surgeon: Reva Boresanya S Pratt, MD;  Location: [facility];  Service: Gynecology;  Laterality: N/A;  . Cervical cerclage    . Cervical cerclage N/A 04/23/2014    Procedure: CERCLAGE CERVICAL;  Surgeon: Reva Boresanya S Pratt, MD;  Location: [facility];  Service: Gynecology;  Laterality: N/A;    Family History  Problem Relation Age of Onset  . Cancer Father   . Cancer Maternal Grandmother     breast- great grandma    History  Substance Use Topics  . Smoking status: Never Smoker   . Smokeless tobacco: Never Used  . Alcohol Use: Yes     Comment: rare      Allergies  Allergen Reactions  . Latex Itching and Rash    Facility-administered medications prior to admission  Medication Dose Route Frequency Provider Last Rate Last Dose  . hydroxyprogesterone caproate (DELALUTIN) 250 mg/mL injection 250 mg  250 mg Intramuscular Weekly Lesly DukesKelly H Leggett, MD   250 mg at 07/15/14 1500    Prescriptions prior to admission  Medication Sig Dispense Refill  . aspirin 81 MG tablet Take 81 mg by mouth daily.      Marland Kitchen. glucose blood (ACCU-CHEK SMARTVIEW) test strip Use as instructed to check blood sugars  100 each  12  . Levothyroxine Sodium (TIROSINT) 50 MCG CAPS Take 1 capsule by mouth daily. 75mcg and 88 mcg alternating.      . Magnesium 500 MG CAPS Take 1 capsule by mouth daily.      Marland Kitchen. OVER THE COUNTER MEDICATION Take 1 capsule by mouth 2 (two) times daily. Pt states that she takes 100 mg caprylic acid gel capsules in the morning and at night.      . Probiotic Product (PROBIOTIC PO) Take 1 tablet by mouth 2 (two) times daily. Pt states that she takes this in the morning and at night.      . TURMERIC PO Take 1 capsule by mouth 2 (two) times daily.      Marland Kitchen. ACCU-CHEK FASTCLIX LANCETS MISC 1 Units by Percutaneous route 4 (four) times daily.  100 each  12  . Blood Glucose Monitor Software DEVI 1 Device by Does not apply route once.  1 Device  0    Review of  Systems - Negative except for what is mentioned in HPI.  Physical Exam  Blood pressure 97/65, pulse 107, temperature 97.6 F (36.4 C), temperature source Oral, resp. rate 16, height 5\' 4"  (1.626 m), weight 61.689 kg (136 lb), last menstrual period 10/25/2013, SpO2 100.00%. GENERAL: Well-developed, well-nourished female in no acute distress.  LUNGS: Clear to auscultation bilaterally.  HEART: Regular rate and rhythm. ABDOMEN: Soft, nontender, nondistended, gravid.  EXTREMITIES: Nontender, no edema, 2+ distal pulses. Dilation: Closed Cervical Position: Posterior Station: -1 Exam by:: Dr Alycia RossettiKoch   Presentation: cephalic FHT:  Baseline rate 150 bpm   Variability moderate  Accelerations present   Decelerations none Minimal uterine excitability     Labs: No results found for this or any previous visit (from the past 24 hour(s)).  Imaging Studies:  [place] Ob Follow Up  06/17/2014   OBSTETRICAL ULTRASOUND: This exam was performed  within a [place] [facility]. The OB US report was generated in the AS system, and faxed to the ordering physician.   This report is available in the [employer] PACS. See the [facility] Obstetric US report via the Image Link.   Assessment: Barbara Tyler is  38 y.o. [id number] at 6052w2d presents with Abdominal Cramping and vaginal pressure  .  Plan:  #Gestation of 3252w2d --Cerclage will remain until 3643w6d --Advised pt to rest and hydrate as much as possible; will give note for work for next 2 weeks. --Will administer 1 dose of procardia now and watch for 30 min. --Procardia, 10mg , prn q6hrs as needed at home. Explained to pt when to use. --Continue prenatal care as previously planned. --Return if LOF, increase in frequency, intensity of contractions, VB  #FWB --NST reactive. --Mom reports no change in FM   Report given to Dr. Loreta AveAcosta at 2000hrs.  [name], Kari L 10/23/20157:54 PM  Seen also by me Agree with note Aviva SignsMarie L Kenleigh Toback, CNM

## 2014-07-19 ENCOUNTER — Telehealth: Payer: Self-pay | Admitting: *Deleted

## 2014-07-19 NOTE — Telephone Encounter (Signed)
Pt called about FMLA paperwork for spouse.  Contacted patient and informed her to have paperwork sent to office and we would complete as soon as possible and fax to her husband employer.  Pt verbalizes understanding.

## 2014-07-20 NOTE — MAU Provider Note (Signed)
`````  Attestation of Attending Supervision of Advanced Practitioner: Evaluation and management procedures were performed by the PA/NP/CNM/OB Fellow under my supervision/collaboration. Chart reviewed and agree with management and plan.  Michelyn Scullin V 07/20/2014 5:52 PM

## 2014-07-22 ENCOUNTER — Ambulatory Visit: Payer: Medicaid Other | Admitting: *Deleted

## 2014-07-22 VITALS — BP 93/61 | HR 97 | Temp 98.0°F

## 2014-07-22 DIAGNOSIS — Z7189 Other specified counseling: Secondary | ICD-10-CM

## 2014-07-22 NOTE — Progress Notes (Signed)
17P injection given.  Spouses FMLA paperwork dropped off

## 2014-07-26 ENCOUNTER — Encounter (HOSPITAL_COMMUNITY): Payer: Self-pay | Admitting: *Deleted

## 2014-07-27 ENCOUNTER — Ambulatory Visit (INDEPENDENT_AMBULATORY_CARE_PROVIDER_SITE_OTHER): Payer: Medicaid Other | Admitting: Family Medicine

## 2014-07-27 VITALS — BP 100/62 | HR 99 | Temp 98.3°F | Wt 139.4 lb

## 2014-07-27 DIAGNOSIS — O0991 Supervision of high risk pregnancy, unspecified, first trimester: Secondary | ICD-10-CM

## 2014-07-27 DIAGNOSIS — O09891 Supervision of other high risk pregnancies, first trimester: Secondary | ICD-10-CM

## 2014-07-27 DIAGNOSIS — O26873 Cervical shortening, third trimester: Secondary | ICD-10-CM

## 2014-07-27 DIAGNOSIS — O09211 Supervision of pregnancy with history of pre-term labor, first trimester: Secondary | ICD-10-CM

## 2014-07-27 LAB — POCT URINALYSIS DIP (DEVICE)
BILIRUBIN URINE: NEGATIVE
GLUCOSE, UA: NEGATIVE mg/dL
Nitrite: NEGATIVE
Protein, ur: 30 mg/dL — AB
SPECIFIC GRAVITY, URINE: 1.02 (ref 1.005–1.030)
Urobilinogen, UA: 0.2 mg/dL (ref 0.0–1.0)
pH: 6.5 (ref 5.0–8.0)

## 2014-07-27 MED ORDER — HYDROXYZINE PAMOATE 25 MG PO CAPS: 25.0000 mg | ORAL_CAPSULE | Freq: Three times a day (TID) | ORAL | Status: DC | PRN

## 2014-07-27 NOTE — Patient Instructions (Signed)
Third Trimester of Pregnancy The third trimester is from week 29 through week 42, months 7 through 9. This trimester is when your unborn baby (fetus) is growing very fast. At the end of the ninth month, the unborn baby is about 20 inches in length. It weighs about 6-10 pounds.  HOME CARE   Avoid all smoking, herbs, and alcohol. Avoid drugs not approved by your doctor.  Only take medicine as told by your doctor. Some medicines are safe and some are not during pregnancy.  Exercise only as told by your doctor. Stop exercising if you start having cramps.  Eat regular, healthy meals.  Wear a good support bra if your breasts are tender.  Do not use hot tubs, steam rooms, or saunas.  Wear your seat belt when driving.  Avoid raw meat, uncooked cheese, and liter boxes and soil used by cats.  Take your prenatal vitamins.  Try taking medicine that helps you poop (stool softener) as needed, and if your doctor approves. Eat more fiber by eating fresh fruit, vegetables, and whole grains. Drink enough fluids to keep your pee (urine) clear or pale yellow.  Take warm water baths (sitz baths) to soothe pain or discomfort caused by hemorrhoids. Use hemorrhoid cream if your doctor approves.  If you have puffy, bulging veins (varicose veins), wear support hose. Raise (elevate) your feet for 15 minutes, 3-4 times a day. Limit salt in your diet.  Avoid heavy lifting, wear low heels, and sit up straight.  Rest with your legs raised if you have leg cramps or low back pain.  Visit your dentist if you have not gone during your pregnancy. Use a soft toothbrush to brush your teeth. Be gentle when you floss.  You can have sex (intercourse) unless your doctor tells you not to.  Do not travel far distances unless you must. Only do so with your doctor's approval.  Take prenatal classes.  Practice driving to the hospital.  Pack your hospital bag.  Prepare the baby's room.  Go to your doctor visits. GET  HELP IF:  You are not sure if you are in labor or if your water has broken.  You are dizzy.  You have mild cramps or pressure in your lower belly (abdominal).  You have a nagging pain in your belly area.  You continue to feel sick to your stomach (nauseous), throw up (vomit), or have watery poop (diarrhea).  You have bad smelling fluid coming from your vagina.  You have pain with peeing (urination). GET HELP RIGHT AWAY IF:   You have a fever.  You are leaking fluid from your vagina.  You are spotting or bleeding from your vagina.  You have severe belly cramping or pain.  You lose or gain weight rapidly.  You have trouble catching your breath and have chest pain.  You notice sudden or extreme puffiness (swelling) of your face, hands, ankles, feet, or legs.  You have not felt the baby move in over an hour.  You have severe headaches that do not go away with medicine.  You have vision changes. Document Released: 12/05/2009 Document Revised: 01/05/2013 Document Reviewed: 11/11/2012 ExitCare Patient Information 2015 [employer]. This information is not intended to replace advice given to you by your health care provider. Make sure you discuss any questions you have with your health care provider.  

## 2014-07-27 NOTE — Progress Notes (Signed)
Desires cultures next week with cerclage removal

## 2014-07-27 NOTE — Progress Notes (Signed)
Doing well, had some cramps, but no contractions.  Having panic attacks, which started after getting cerclage, but have been worsening.  Happen at random times.  Will give prescription for vistaril.  Discussed that may cause drowsiness.  Good fetal activity.  Cerclage removal and cultures next week.  Urine sent for cx - protein, moderate leuks, trace blood.

## 2014-07-28 ENCOUNTER — Encounter: Payer: Self-pay | Admitting: *Deleted

## 2014-07-29 ENCOUNTER — Ambulatory Visit (INDEPENDENT_AMBULATORY_CARE_PROVIDER_SITE_OTHER): Payer: Medicaid Other | Admitting: General Practice

## 2014-07-29 VITALS — BP 96/71 | HR 87 | Temp 98.1°F | Ht 64.0 in | Wt 138.4 lb

## 2014-07-29 DIAGNOSIS — O09211 Supervision of pregnancy with history of pre-term labor, first trimester: Secondary | ICD-10-CM

## 2014-07-29 DIAGNOSIS — O09891 Supervision of other high risk pregnancies, first trimester: Secondary | ICD-10-CM

## 2014-07-29 LAB — URINE CULTURE
Colony Count: NO GROWTH
Organism ID, Bacteria: NO GROWTH

## 2014-08-04 ENCOUNTER — Encounter: Payer: Self-pay | Admitting: *Deleted

## 2014-08-05 ENCOUNTER — Other Ambulatory Visit: Payer: Self-pay | Admitting: Family Medicine

## 2014-08-05 ENCOUNTER — Ambulatory Visit (INDEPENDENT_AMBULATORY_CARE_PROVIDER_SITE_OTHER): Payer: Medicaid Other | Admitting: Family Medicine

## 2014-08-05 VITALS — BP 108/72 | HR 115 | Temp 98.1°F | Wt 136.9 lb

## 2014-08-05 DIAGNOSIS — Z23 Encounter for immunization: Secondary | ICD-10-CM

## 2014-08-05 DIAGNOSIS — O09211 Supervision of pregnancy with history of pre-term labor, first trimester: Secondary | ICD-10-CM

## 2014-08-05 DIAGNOSIS — E039 Hypothyroidism, unspecified: Secondary | ICD-10-CM

## 2014-08-05 DIAGNOSIS — O26873 Cervical shortening, third trimester: Secondary | ICD-10-CM

## 2014-08-05 DIAGNOSIS — O09891 Supervision of other high risk pregnancies, first trimester: Secondary | ICD-10-CM

## 2014-08-05 DIAGNOSIS — O0991 Supervision of high risk pregnancy, unspecified, first trimester: Secondary | ICD-10-CM

## 2014-08-05 DIAGNOSIS — E038 Other specified hypothyroidism: Secondary | ICD-10-CM

## 2014-08-05 LAB — POCT URINALYSIS DIP (DEVICE)
BILIRUBIN URINE: NEGATIVE
Glucose, UA: NEGATIVE mg/dL
HGB URINE DIPSTICK: NEGATIVE
Ketones, ur: NEGATIVE mg/dL
NITRITE: NEGATIVE
PH: 6 (ref 5.0–8.0)
Protein, ur: NEGATIVE mg/dL
Specific Gravity, Urine: 1.015 (ref 1.005–1.030)
Urobilinogen, UA: 0.2 mg/dL (ref 0.0–1.0)

## 2014-08-05 LAB — OB RESULTS CONSOLE GBS: GBS: NEGATIVE

## 2014-08-05 LAB — OB RESULTS CONSOLE GC/CHLAMYDIA
Chlamydia: NEGATIVE
Gonorrhea: NEGATIVE

## 2014-08-05 MED ORDER — TETANUS-DIPHTH-ACELL PERTUSSIS 5-2.5-18.5 LF-MCG/0.5 IM SUSP: 0.5000 mL | Freq: Once | INTRAMUSCULAR | Status: DC

## 2014-08-05 NOTE — Patient Instructions (Signed)
Third Trimester of Pregnancy The third trimester is from week 29 through week 42, months 7 through 9. The third trimester is a time when the fetus is growing rapidly. At the end of the ninth month, the fetus is about 20 inches in length and weighs 6-10 pounds.  BODY CHANGES Your body goes through many changes during pregnancy. The changes vary from woman to woman.   Your weight will continue to increase. You can expect to gain 25-35 pounds (11-16 kg) by the end of the pregnancy.  You may begin to get stretch marks on your hips, abdomen, and breasts.  You may urinate more often because the fetus is moving lower into your pelvis and pressing on your bladder.  You may develop or continue to have heartburn as a result of your pregnancy.  You may develop constipation because certain hormones are causing the muscles that push waste through your intestines to slow down.  You may develop hemorrhoids or swollen, bulging veins (varicose veins).  You may have pelvic pain because of the weight gain and pregnancy hormones relaxing your joints between the bones in your pelvis. Backaches may result from overexertion of the muscles supporting your posture.  You may have changes in your hair. These can include thickening of your hair, rapid growth, and changes in texture. Some women also have hair loss during or after pregnancy, or hair that feels dry or thin. Your hair will most likely return to normal after your baby is born.  Your breasts will continue to grow and be tender. A yellow discharge may leak from your breasts called colostrum.  Your belly button may stick out.  You may feel short of breath because of your expanding uterus.  You may notice the fetus "dropping," or moving lower in your abdomen.  You may have a bloody mucus discharge. This usually occurs a few days to a week before labor begins.  Your cervix becomes thin and soft (effaced) near your due date. WHAT TO EXPECT AT YOUR PRENATAL  EXAMS  You will have prenatal exams every 2 weeks until week 36. Then, you will have weekly prenatal exams. During a routine prenatal visit:  You will be weighed to make sure you and the fetus are growing normally.  Your blood pressure is taken.  Your abdomen will be measured to track your baby's growth.  The fetal heartbeat will be listened to.  Any test results from the previous visit will be discussed.  You may have a cervical check near your due date to see if you have effaced. At around 36 weeks, your caregiver will check your cervix. At the same time, your caregiver will also perform a test on the secretions of the vaginal tissue. This test is to determine if a type of bacteria, Group B streptococcus, is present. Your caregiver will explain this further. Your caregiver may ask you:  What your birth plan is.  How you are feeling.  If you are feeling the baby move.  If you have had any abnormal symptoms, such as leaking fluid, bleeding, severe headaches, or abdominal cramping.  If you have any questions. Other tests or screenings that may be performed during your third trimester include:  Blood tests that check for low iron levels (anemia).  Fetal testing to check the health, activity level, and growth of the fetus. Testing is done if you have certain medical conditions or if there are problems during the pregnancy. FALSE LABOR You may feel small, irregular contractions that   eventually go away. These are called Braxton Hicks contractions, or false labor. Contractions may last for hours, days, or even weeks before true labor sets in. If contractions come at regular intervals, intensify, or become painful, it is best to be seen by your caregiver.  SIGNS OF LABOR   Menstrual-like cramps.  Contractions that are 5 minutes apart or less.  Contractions that start on the top of the uterus and spread down to the lower abdomen and back.  A sense of increased pelvic pressure or back  pain.  A watery or bloody mucus discharge that comes from the vagina. If you have any of these signs before the 37th week of pregnancy, call your caregiver right away. You need to go to the hospital to get checked immediately. HOME CARE INSTRUCTIONS   Avoid all smoking, herbs, alcohol, and unprescribed drugs. These chemicals affect the formation and growth of the baby.  Follow your caregiver's instructions regarding medicine use. There are medicines that are either safe or unsafe to take during pregnancy.  Exercise only as directed by your caregiver. Experiencing uterine cramps is a good sign to stop exercising.  Continue to eat regular, healthy meals.  Wear a good support bra for breast tenderness.  Do not use hot tubs, steam rooms, or saunas.  Wear your seat belt at all times when driving.  Avoid raw meat, uncooked cheese, cat litter boxes, and soil used by cats. These carry germs that can cause birth defects in the baby.  Take your prenatal vitamins.  Try taking a stool softener (if your caregiver approves) if you develop constipation. Eat more high-fiber foods, such as fresh vegetables or fruit and whole grains. Drink plenty of fluids to keep your urine clear or pale yellow.  Take warm sitz baths to soothe any pain or discomfort caused by hemorrhoids. Use hemorrhoid cream if your caregiver approves.  If you develop varicose veins, wear support hose. Elevate your feet for 15 minutes, 3-4 times a day. Limit salt in your diet.  Avoid heavy lifting, wear low heal shoes, and practice good posture.  Rest a lot with your legs elevated if you have leg cramps or low back pain.  Visit your dentist if you have not gone during your pregnancy. Use a soft toothbrush to brush your teeth and be gentle when you floss.  A sexual relationship may be continued unless your caregiver directs you otherwise.  Do not travel far distances unless it is absolutely necessary and only with the approval  of your caregiver.  Take prenatal classes to understand, practice, and ask questions about the labor and delivery.  Make a trial run to the hospital.  Pack your hospital bag.  Prepare the baby's nursery.  Continue to go to all your prenatal visits as directed by your caregiver. SEEK MEDICAL CARE IF:  You are unsure if you are in labor or if your water has broken.  You have dizziness.  You have mild pelvic cramps, pelvic pressure, or nagging pain in your abdominal area.  You have persistent nausea, vomiting, or diarrhea.  You have a bad smelling vaginal discharge.  You have pain with urination. SEEK IMMEDIATE MEDICAL CARE IF:   You have a fever.  You are leaking fluid from your vagina.  You have spotting or bleeding from your vagina.  You have severe abdominal cramping or pain.  You have rapid weight loss or gain.  You have shortness of breath with chest pain.  You notice sudden or extreme swelling   of your face, hands, ankles, feet, or legs.  You have not felt your baby move in over an hour.  You have severe headaches that do not go away with medicine.  You have vision changes. Document Released: 09/04/2001 Document Revised: 09/15/2013 Document Reviewed: 11/11/2012 Exit[employer] Patient Information 2015 [employer]. This information is not intended to replace advice given to you by your health care provider. Make sure you discuss any questions you have with your health care provider.  

## 2014-08-05 NOTE — Progress Notes (Signed)
C/o hemorrhoid

## 2014-08-05 NOTE — Progress Notes (Signed)
Completed 17-P.  Cerclage removed today.  GBS, GC/CT obtained today.  No contractions or bleeding.  Anxiety better controlled.  F/u 1 week.

## 2014-08-06 LAB — GC/CHLAMYDIA PROBE AMP
CT Probe RNA: NEGATIVE
GC PROBE AMP APTIMA: NEGATIVE

## 2014-08-07 LAB — CULTURE, BETA STREP (GROUP B ONLY)

## 2014-08-09 ENCOUNTER — Telehealth: Payer: Self-pay

## 2014-08-09 MED ORDER — SERTRALINE HCL 50 MG PO TABS: 50.0000 mg | ORAL_TABLET | Freq: Every day | ORAL | Status: DC

## 2014-08-09 NOTE — Telephone Encounter (Signed)
Called patient and discussed symptoms of worsening anxiety.  Will start Zoloft 50mg  now.  Has appt with me Thursday.

## 2014-08-09 NOTE — Telephone Encounter (Signed)
Patient called stating she saw Dr. Adrian BlackwaterStinson last week and they spoke about starting antianxiety medication-- she believes she would like to start medication now as she was up  all night with anxiety. Requests to speak to Dr. Adrian BlackwaterStinson so that she will feel better about starting meds. Dr. Adrian BlackwaterStinson attempted to contact patient-- not available. He will try again later today.

## 2014-08-10 ENCOUNTER — Telehealth: Payer: Self-pay | Admitting: *Deleted

## 2014-08-10 NOTE — Telephone Encounter (Addendum)
Pt left message stating that she started Zoloft medication yesterday.  Today she is having really bad anxiety - "the worst it has ever been".  Please call back.  I returned her call and discussed her concern. I stated that it takes about 2 weeks for the Zoloft to reach a therapeutic level in her blood. Therefore, nothing she is experiencing would be related to the medication at this time. Pt voiced understanding and also stated that she is feeling a little bit better than when she left the message. I advised her that she may discuss further at her appt on 11/19 if she still has questions. She agreed.

## 2014-08-11 ENCOUNTER — Inpatient Hospital Stay (HOSPITAL_COMMUNITY)
Admission: AD | Admit: 2014-08-11 | Discharge: 2014-08-12 | DRG: 775 | Disposition: A | Discharge status: Home / Self Care | Payer: Medicaid Other | Source: Ambulatory Visit | Attending: Obstetrics & Gynecology | Admitting: Obstetrics & Gynecology

## 2014-08-11 ENCOUNTER — Encounter (HOSPITAL_COMMUNITY): Payer: Self-pay | Admitting: *Deleted

## 2014-08-11 DIAGNOSIS — Z3A38 38 weeks gestation of pregnancy: Secondary | ICD-10-CM | POA: Diagnosis present

## 2014-08-11 DIAGNOSIS — Z3483 Encounter for supervision of other normal pregnancy, third trimester: Secondary | ICD-10-CM | POA: Diagnosis present

## 2014-08-11 DIAGNOSIS — Z23 Encounter for immunization: Secondary | ICD-10-CM

## 2014-08-11 DIAGNOSIS — O09523 Supervision of elderly multigravida, third trimester: Secondary | ICD-10-CM

## 2014-08-11 DIAGNOSIS — O26873 Cervical shortening, third trimester: Secondary | ICD-10-CM

## 2014-08-11 DIAGNOSIS — O2623 Pregnancy care for patient with recurrent pregnancy loss, third trimester: Secondary | ICD-10-CM

## 2014-08-11 LAB — CBC
HCT: 33.7 % — ABNORMAL LOW (ref 36.0–46.0)
HEMOGLOBIN: 11.4 g/dL — AB (ref 12.0–15.0)
MCH: 30.4 pg (ref 26.0–34.0)
MCHC: 33.8 g/dL (ref 30.0–36.0)
MCV: 89.9 fL (ref 78.0–100.0)
Platelets: 294 10*3/uL (ref 150–400)
RBC: 3.75 MIL/uL — ABNORMAL LOW (ref 3.87–5.11)
RDW: 13.1 % (ref 11.5–15.5)
WBC: 19.5 10*3/uL — ABNORMAL HIGH (ref 4.0–10.5)

## 2014-08-11 LAB — RPR

## 2014-08-11 MED ORDER — SODIUM CHLORIDE 0.9 % IJ SOLN: 3.0000 mL | INTRAMUSCULAR | Status: DC | PRN

## 2014-08-11 MED ORDER — SIMETHICONE 80 MG PO CHEW: 80.0000 mg | CHEWABLE_TABLET | ORAL | Status: DC | PRN

## 2014-08-11 MED ORDER — SERTRALINE HCL 50 MG PO TABS
50.0000 mg | ORAL_TABLET | Freq: Every day | ORAL | Status: DC
  Administered 2014-08-12: 50 mg via ORAL
  Filled 2014-08-11 (×3): qty 1

## 2014-08-11 MED ORDER — LACTATED RINGERS IV SOLN: INTRAVENOUS | Status: DC

## 2014-08-11 MED ORDER — BENZOCAINE-MENTHOL 20-0.5 % EX AERO: 1.0000 "application " | INHALATION_SPRAY | CUTANEOUS | Status: DC | PRN

## 2014-08-11 MED ORDER — FLEET ENEMA 7-19 GM/118ML RE ENEM: 1.0000 | ENEMA | Freq: Every day | RECTAL | Status: DC | PRN

## 2014-08-11 MED ORDER — SODIUM CHLORIDE 0.9 % IJ SOLN: 3.0000 mL | Freq: Two times a day (BID) | INTRAMUSCULAR | Status: DC

## 2014-08-11 MED ORDER — FLEET ENEMA 7-19 GM/118ML RE ENEM: 1.0000 | ENEMA | RECTAL | Status: DC | PRN

## 2014-08-11 MED ORDER — WITCH HAZEL-GLYCERIN EX PADS: 1.0000 "application " | MEDICATED_PAD | CUTANEOUS | Status: DC | PRN

## 2014-08-11 MED ORDER — LANOLIN HYDROUS EX OINT: TOPICAL_OINTMENT | CUTANEOUS | Status: DC | PRN

## 2014-08-11 MED ORDER — OXYTOCIN 40 UNITS IN LACTATED RINGERS INFUSION - SIMPLE MED: 62.5000 mL/h | INTRAVENOUS | Status: DC | PRN

## 2014-08-11 MED ORDER — BISACODYL 10 MG RE SUPP
10.0000 mg | Freq: Every day | RECTAL | Status: DC | PRN
Start: 2014-08-11 — End: 2014-08-12

## 2014-08-11 MED ORDER — ONDANSETRON HCL 4 MG/2ML IJ SOLN: 4.0000 mg | Freq: Four times a day (QID) | INTRAMUSCULAR | Status: DC | PRN

## 2014-08-11 MED ORDER — OXYTOCIN 40 UNITS IN LACTATED RINGERS INFUSION - SIMPLE MED: 62.5000 mL/h | INTRAVENOUS | Status: DC

## 2014-08-11 MED ORDER — OXYTOCIN 10 UNIT/ML IJ SOLN
INTRAMUSCULAR | Status: AC
  Filled 2014-08-11: qty 1

## 2014-08-11 MED ORDER — DIBUCAINE 1 % RE OINT: 1.0000 "application " | TOPICAL_OINTMENT | RECTAL | Status: DC | PRN

## 2014-08-11 MED ORDER — DIPHENHYDRAMINE HCL 25 MG PO CAPS
25.0000 mg | ORAL_CAPSULE | Freq: Four times a day (QID) | ORAL | Status: DC | PRN
  Administered 2014-08-11 – 2014-08-12 (×2): 25 mg via ORAL
  Filled 2014-08-11 (×2): qty 1

## 2014-08-11 MED ORDER — ONDANSETRON HCL 4 MG PO TABS: 4.0000 mg | ORAL_TABLET | ORAL | Status: DC | PRN

## 2014-08-11 MED ORDER — OXYTOCIN BOLUS FROM INFUSION: 500.0000 mL | INTRAVENOUS | Status: DC

## 2014-08-11 MED ORDER — PRENATAL MULTIVITAMIN CH: 1.0000 | ORAL_TABLET | Freq: Every day | ORAL | Status: DC

## 2014-08-11 MED ORDER — LIDOCAINE HCL (PF) 1 % IJ SOLN
30.0000 mL | INTRAMUSCULAR | Status: DC | PRN
  Filled 2014-08-11: qty 30

## 2014-08-11 MED ORDER — LEVOTHYROXINE SODIUM 88 MCG PO CAPS
88.0000 ug | ORAL_CAPSULE | ORAL | Status: DC
  Administered 2014-08-12: 88 ug via ORAL

## 2014-08-11 MED ORDER — ACETAMINOPHEN 325 MG PO TABS: 650.0000 mg | ORAL_TABLET | ORAL | Status: DC | PRN

## 2014-08-11 MED ORDER — IBUPROFEN 600 MG PO TABS: 600.0000 mg | ORAL_TABLET | Freq: Four times a day (QID) | ORAL | Status: DC

## 2014-08-11 MED ORDER — LEVOTHYROXINE SODIUM 75 MCG PO CAPS
75.0000 ug | ORAL_CAPSULE | ORAL | Status: DC
  Administered 2014-08-11: 75 ug via ORAL
  Filled 2014-08-11: qty 1

## 2014-08-11 MED ORDER — LACTATED RINGERS IV SOLN: 500.0000 mL | INTRAVENOUS | Status: DC | PRN

## 2014-08-11 MED ORDER — TETANUS-DIPHTH-ACELL PERTUSSIS 5-2.5-18.5 LF-MCG/0.5 IM SUSP: 0.5000 mL | Freq: Once | INTRAMUSCULAR | Status: DC

## 2014-08-11 MED ORDER — SENNOSIDES-DOCUSATE SODIUM 8.6-50 MG PO TABS: 2.0000 | ORAL_TABLET | ORAL | Status: DC

## 2014-08-11 MED ORDER — SODIUM CHLORIDE 0.9 % IV SOLN: 250.0000 mL | INTRAVENOUS | Status: DC | PRN

## 2014-08-11 MED ORDER — OXYCODONE-ACETAMINOPHEN 5-325 MG PO TABS: 1.0000 | ORAL_TABLET | ORAL | Status: DC | PRN

## 2014-08-11 MED ORDER — CITRIC ACID-SODIUM CITRATE 334-500 MG/5ML PO SOLN: 30.0000 mL | ORAL | Status: DC | PRN

## 2014-08-11 MED ORDER — ONDANSETRON HCL 4 MG/2ML IJ SOLN: 4.0000 mg | INTRAMUSCULAR | Status: DC | PRN

## 2014-08-11 MED ORDER — ZOLPIDEM TARTRATE 5 MG PO TABS: 5.0000 mg | ORAL_TABLET | Freq: Every evening | ORAL | Status: DC | PRN

## 2014-08-11 MED ORDER — OXYCODONE-ACETAMINOPHEN 5-325 MG PO TABS: 2.0000 | ORAL_TABLET | ORAL | Status: DC | PRN

## 2014-08-11 NOTE — Lactation Note (Signed)
This note was copied from the chart of Barbara Loree FeeBarbara Ariel. Lactation Consultation Note Initial visit at 10 hours of age.  Mom is currently breastfeeding her 36 year old.  Mom is able to hand express colostrum.  Mom is holding baby to breast with shallow latch and leaning over baby.  Mom reports she doesn't remember how to latch baby.  Discussed positioning and assisted with cross cradle hold.  Baby has wide flanged lips with rhythmic sucking, pulled off a few times and relatched for greater than 30 minutes during visit.  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  Mom also reports an extensive history of yeast and mastitis with now 36 year old that went on for about 6 months requiring specialist treatment.  Mom reports continuing to use grapeseed extract on nipples and Vania ReaJack Newmans all purpose nipple cream on going. Right nipple is pink with skin intact.  Encouraged mom to discussed possible treatment for raynauds with her OB.  Peds note indicated a tight lingual frenulum, oral assessment not done at this visit and with good breast positioning baby is able to maintain latch at this time.  Mom does report older sibling has a "lip tie".  This dyad will require follow up inpatient and discussed out patient services available.  [facility] and discussed.  Encouraged to feed with early cues on demand.  Early newborn behavior discussed.  Hand expression demonstrated with colostrum visible.  Mom to call for assist as needed.    Patient Name: Barbara Tyler UJWJX'BToday's Date: 08/11/2014 Reason for consult: Initial assessment   Maternal Data Has patient been taught Hand Expression?: Yes Does the patient have breastfeeding experience prior to this delivery?: Yes  Feeding Feeding Type: Breast Fed Length of feed: 15 min  LATCH Score/Interventions Latch: Grasps breast easily, tongue down, lips flanged, rhythmical  sucking.  Audible Swallowing: A few with stimulation Intervention(s): Skin to skin;Hand expression;Alternate breast massage  Type of Nipple: Everted at rest and after stimulation  Comfort (Breast/Nipple): Soft / non-tender (nipple blanched white after feeding possible indicating raynauds as mom does have this history)     Hold (Positioning): Assistance needed to correctly position infant at breast and maintain latch. Intervention(s): Breastfeeding basics reviewed;Support Pillows;Position options;Skin to skin  LATCH Score: 8  Lactation Tools Discussed/Used     Consult Status Consult Status: Follow-up Date: 08/12/14 Follow-up type: In-patient    Barbara Tyler, Barbara Tyler 08/11/2014, 6:20 PM

## 2014-08-11 NOTE — H&P (Signed)
Barbara McmurrayBarbara L Tyler is a 38 y.o. female presenting in active labor dilated to 9cm.  Maternal Medical History:  Reason for admission: Nausea.    OB History    Gravida Para Term Preterm AB TAB SAB Ectopic Multiple Living   7 2 1 1 4  4   2      Past Medical History  Diagnosis Date  . Preterm labor   . Infection     UTI  . Kidney stones   . Abnormal Pap smear     cryo, clean since  . Depression   . Factor 5 Leiden mutation, heterozygous   . Hypothyroidism    Past Surgical History  Procedure Laterality Date  . No past surgeries    . Cryotherapy    . Dilation and evacuation N/A 02/27/2013    Procedure: DILATATION AND EVACUATION;  Surgeon: Reva Boresanya S Pratt, MD;  Location: [facility];  Service: Gynecology;  Laterality: N/A;  . Cervical cerclage    . Cervical cerclage N/A 04/23/2014    Procedure: CERCLAGE CERVICAL;  Surgeon: Reva Boresanya S Pratt, MD;  Location: [facility];  Service: Gynecology;  Laterality: N/A;   Family History: family history includes Cancer in her father and maternal grandmother. Social History:  reports that she has never smoked. She has never used smokeless tobacco. She reports that she drinks alcohol. She reports that she does not use illicit drugs.   Clinic  [facility]  Dating  6 wk ultrasound  Genetic Screen 1 Screen: Nml         AFP: Negative     Anatomic US Normal => CL 2w f/u nml  GTT declined  TDaP vaccine   Flu vaccine  October 2014,declined 07/08/14  GBS Negative  Contraception   Baby Food  Breast   Circumcision   Pediatrician  Dr Maryellen Pileavid Rubin  Support Person  Significant other - Barbara Tyler  Pap neg; GC/CT neg x 2 CL q2week 24-28wk  Review of Systems  Constitutional: Negative for fever and chills.  Gastrointestinal: Negative for heartburn, nausea, vomiting and diarrhea.  Genitourinary: Negative for dysuria, urgency, frequency and hematuria.  Neurological:       No headache    Dilation:  (svd) Effacement (%): 100 Station: +2, +1 Exam by:: Dr Loreta AveAcosta Height 5\' 4"   (1.626 m), weight 136 lb (61.689 kg), last menstrual period 10/25/2013. Exam Physical Exam  Constitutional: She is oriented to person, place, and time. She appears well-developed and well-nourished.  HENT:  Head: Normocephalic and atraumatic.  Eyes: Conjunctivae and EOM are normal.  Neck: Normal range of motion.  Cardiovascular: Normal rate.   Respiratory: Effort normal. No respiratory distress.  GI: Soft. Bowel sounds are normal. She exhibits no distension. There is no tenderness.  Musculoskeletal: Normal range of motion. She exhibits no edema.  Neurological: She is alert and oriented to person, place, and time.  Skin: Skin is warm and dry. No erythema.    Prenatal labs: ABO, Rh: A/POS/-- (04/15 1016) Antibody: NEG (04/15 1016) Rubella: 2.24 (04/15 1016) RPR: NON REAC (08/20 1323)  HBsAg: NEGATIVE (04/15 1016)  HIV: NONREACTIVE (08/20 1323)  GBS: Negative (11/12 0000)  1h gtt: declined  Assessment/Plan: MOF: breast MOC: will discuss with patient Labor: anticipate delivery very soon ID: GBS neg TDAP postpartum Desires placenta encapsulation  Barbara Tyler Barbara Tyler 08/11/2014, 7:47 AM

## 2014-08-11 NOTE — MAU Note (Signed)
Pt came to MAU with c/o ruptured membranes an UC's. Positive fetal movement. States small amount of vaginal bleeding. Pt very calm and initially unable to sit/lay on stretcher.

## 2014-08-11 NOTE — Plan of Care (Signed)
Problem: Phase II Progression Outcomes Goal: Other Phase II Outcomes/Goals Outcome: Not Applicable Date Met:  48/49/86

## 2014-08-11 NOTE — Plan of Care (Signed)
Problem: Phase I Progression Outcomes Goal: Pain controlled with appropriate interventions Outcome: Completed/Met Date Met:  08/11/14 Goal: Voiding adequately Outcome: Completed/Met Date Met:  08/11/14 Goal: OOB as tolerated unless otherwise ordered Outcome: Completed/Met Date Met:  08/11/14 Goal: VS, stable, temp < 100.4 degrees F Outcome: Completed/Met Date Met:  08/11/14 Goal: Initial discharge plan identified Outcome: Completed/Met Date Met:  08/11/14 Goal: Other Phase I Outcomes/Goals Outcome: Not Applicable Date Met:  85/46/27  Problem: Phase II Progression Outcomes Goal: Pain controlled on oral analgesia Outcome: Completed/Met Date Met:  08/11/14 Goal: Progress activity as tolerated unless otherwise ordered Outcome: Completed/Met Date Met:  08/11/14 Goal: Afebrile, VS remain stable Outcome: Completed/Met Date Met:  08/11/14 Goal: Tolerating diet Outcome: Completed/Met Date Met:  08/11/14  Problem: Discharge Progression Outcomes Goal: Barriers To Progression Addressed/Resolved Outcome: Not Applicable Date Met:  03/50/09

## 2014-08-12 ENCOUNTER — Encounter: Payer: Medicaid Other | Admitting: Family Medicine

## 2014-08-12 LAB — HIV ANTIBODY (ROUTINE TESTING W REFLEX): HIV: NONREACTIVE

## 2014-08-12 MED ORDER — IBUPROFEN 600 MG PO TABS: 600.0000 mg | ORAL_TABLET | Freq: Four times a day (QID) | ORAL | Status: DC | PRN

## 2014-08-12 MED ORDER — LORAZEPAM 1 MG PO TABS: 1.0000 mg | ORAL_TABLET | Freq: Two times a day (BID) | ORAL | Status: DC | PRN

## 2014-08-12 NOTE — Plan of Care (Signed)
Problem: Consults Goal: Postpartum Patient Education (See Patient Education module for education specifics.)  Outcome: Completed/Met Date Met:  08/12/14

## 2014-08-12 NOTE — Progress Notes (Signed)
UR chart review completed.  

## 2014-08-12 NOTE — Lactation Note (Signed)
This note was copied from the chart of Boy Loree FeeBarbara Luty. Lactation Consultation Note: Mom has baby latched to breast when I went into room. Baby is hanging off the breast- pulling on nipple. Encouraged to keep baby close to the breast Mom asking about tight frenulum. Baby is able to extend tongue past gumline but does not lift tongue to roof of mouth. No further questions at present. To call for assist prn  Patient Name: Boy Loree FeeBarbara Pagnotta [id number]'s Date: 08/12/2014 Reason for consult: Follow-up assessment   Maternal Data Formula Feeding for Exclusion: No Has patient been taught Hand Expression?: Yes Does the patient have breastfeeding experience prior to this delivery?: Yes  Feeding Feeding Type: Breast Fed Length of feed: 15 min  LATCH Score/Interventions Latch: Grasps breast easily, tongue down, lips flanged, rhythmical sucking.  Audible Swallowing: A few with stimulation Intervention(s): Skin to skin  Type of Nipple: Everted at rest and after stimulation  Comfort (Breast/Nipple): Filling, red/small blisters or bruises, mild/mod discomfort  Problem noted: Mild/Moderate discomfort  Hold (Positioning): Assistance needed to correctly position infant at breast and maintain latch. Intervention(s): Breastfeeding basics reviewed;Support Pillows;Skin to skin  LATCH Score: 7  Lactation Tools Discussed/Used     Consult Status Consult Status: Follow-up Date: 08/13/14 (if still here) Follow-up type: In-patient    Pamelia HoitWeeks, Jerline Linzy D 08/12/2014, 11:10 AM

## 2014-08-12 NOTE — Discharge Summary (Signed)
Obstetric Discharge Summary Reason for Admission: onset of labor Prenatal Procedures: none Intrapartum Procedures: spontaneous vaginal delivery Postpartum Procedures: none Complications-Operative and Postpartum: none HEMOGLOBIN  Date Value Ref Range Status  08/11/2014 11.4* 12.0 - 15.0 g/dL Final   HCT  Date Value Ref Range Status  08/11/2014 33.7* 36.0 - 46.0 % Final    Physical Exam:  General: alert and cooperative Lochia: appropriate Uterine Fundus: firm Incision: n/a DVT Evaluation: No evidence of DVT seen on physical exam.  Discharge Diagnoses: Term Pregnancy-delivered  Discharge Information: Date: 08/12/2014 Activity: pelvic rest Diet: routine Medications: PNV, Ibuprofen and Zoloft and Ativan Condition: stable Instructions: refer to practice specific booklet Discharge to: home   Newborn Data: Live born female  Birth Weight: 7 lb 4.4 oz (3300 g) APGAR: 8, 9  Home with mother.  Jacquelin Hawkingettey, Ralph 08/12/2014, 9:01 AM   I have seen and examined this patient and I agree with the above. Pt is breastfeeding. She will have short term rx for Ativan as well as a 2wk mental health check up in addition to her 6wk PP visit due to depression/anxiety. Cam HaiSHAW, Ronne Stefanski CNM 1:06 AM 08/18/2014

## 2014-08-12 NOTE — Progress Notes (Signed)
Clinical Social Work Department PSYCHOSOCIAL ASSESSMENT - MATERNAL/CHILD 08/12/2014  Patient:  Barbara Tyler,Barbara Tyler  Account Number:  [id number]  Admit Date:  08/11/2014  Childs Name:   Barbara Tyler   Clinical Social Worker:  Erynn Vaca, CLINICAL SOCIAL WORKER   Date/Time:  08/12/2014 08:45 AM  Date Referred:  08/11/2014   Referral source  Central Nursery     Referred reason  Depression/Anxiety   Other referral source:    I:  FAMILY / HOME ENVIRONMENT Child's legal guardian:  PARENT  Guardian - Name Guardian - Age Guardian - Address  [name] 38 [address] [place], [place] [address]  Tim Hodges  same as above   Other household support members/support persons Name Relationship DOB   SON 2005   DAUGHTER 2011   Other support:   MOB's sister has decided to move in to assist the MOB.  The FOB's parents have also decided to spend 1-2 weeks in the postpartum period in the family's home.   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   Financial resources:  Medicaid If Medicaid - County:  [place]  School / Grade:  N/A Maternity Care Coordinator / Child Services Coordination / Early Interventions:   None reported.  Cultural issues impacting care:   None reported.   III  STRENGTHS Strengths  Adequate Resources  Home prepared for Child (including basic supplies)  Supportive family/friends   Strength comment:  MOB has identified Dr. Rubin as her pediatrician.  IV  RISK FACTORS AND CURRENT PROBLEMS Current Problem:  YES   Risk Factor & Current Problem Patient Issue Family Issue Risk Factor / Current Problem Comment  Mental Illness Y N MOB presents with mental health history significant for depression and anxiety.  She currently attends therapy, and was recently started on Zoloft.  MOB reported belieft that her symptoms are not controlled, and continues to feel  anxious and depressed.    V  SOCIAL WORK ASSESSMENT CSW met with the MOB in her room in order to complete the assessment. Consult was ordered due to MOB presenting with a history of anxiety and depression.  She is currently prescribed medication and is in therapy, but it was documented in her chart that the MOB reported prior to the birth of the baby that her anxiety continued to worsen.  MOB was receptive to the visit, and openly discussed her anxiety and depression.  She presented as highly tearful and anxious, and CSW noted rigid and black/white thinking patterns throughout the entire visit.  MOB presents with difficulties in engaging in cognitive restructuring despite her awareness that her thought patterns are irrational.  Due to emotional intensity, the CSW primarily provided supportive listening and guided the MOB to process her feelings.   MOB provided consent for the FOB and her sister to be present for the entire visit.  They presented as supportive as they attempted to assist the MOB to disengage from irrational beliefs, but the MOB continued to struggle to disengage from her core beliefs of being a burden and a bad mother. MOB also presents with active rumination of fears that she will have a panic attack or that she will develop postpartum psychosis.  MOB presents with awareness that ruminating on fears often causes her anxiety to worsen, but she shared that she often struggles to focus on the present.  MOB was receptive to reviewing grounding techniques, and shared that   she often attempts to use these techniques and positive self-talk to assist her to remain in control when she notes anxiety starting to worsen.    MOB verbalized goal of reducing her anxiety and depression since she does not like how she feels when she has a panic attack.  She shared belief that she is a failure since no intervention seems to work. MOB stated that she is currently in therapy at [facility], but she has noted a  minimal difference since starting therapy.  MOB expressed belief that her Zoloft triggered a panic attack, and shared that she is scared that if she takes Zoloft again that she will experience another panic attack.  MOB struggled to disengage from the correlation between the Zoloft and the panic attack, and was unable to identify any other possible reason for the panic attack occuring following the Zoloft. She presented with awareness of the benefit of re-trying Zoloft in the hospital and while her family is present since if there is an adverse reaction, she will have support.  MOB presented with awareness that if she does not continue to problem solve how to address the anxiety, it will likely lead to a panic attack.  She shared that while she fears that Zoloft will trigger another panic attack, Zoloft also may reduce symptoms.   CSW continued to assist the family to problem solve and to identify subsequent steps to address her depression and anxiety.  MOB reported intention to attend follow-up appointment with her therapist on 11/24.  She shared that she would like to receive her Zoloft in the hospital.  Per MOB, her therapist is currently assisting her with a referral for an outpatient psychiatrist.  CSW provided information regarding more immediate access to psychiatry in the event that the referral takes a long period of time ([facility]).  CSW also discussed [facility].  MOB and FOB denied need for inpatient treatment at this time, but thanked CSW for the information regarding the program.  Overall, the family expressed appreciation for the visit.  They had no additional questions at the end of the visit.  Family aware of ongoing CSW availability.   CSW spoke with RN and stated that the MOB is requesting her Zoloft.   No barriers to discharge.    VI SOCIAL WORK PLAN Social Work Plan  Patient/Family Education  Information/Referral to Community  Resources  No Further Intervention Required / No Barriers to Discharge   Type of pt/family education:   Postpartum depression   If child protective services report - county:   If child protective services report - date:   Information/referral to community resources comment:   CSW provided information for immediate medication management services until outpatient therapist can identify a long term psychiatrist for the MOB. CSW provided information regarding [facility], but MOB declined referral.     Other social work plan:   CSW to follow up PRN.     

## 2014-08-12 NOTE — Discharge Instructions (Signed)
Postpartum Depression and Baby Blues °The postpartum period begins right after the birth of a baby. During this time, there is often a great amount of joy and excitement. It is also a time of many changes in the life of the parents. Regardless of how many times a mother gives birth, each child brings new challenges and dynamics to the family. It is not unusual to have feelings of excitement along with confusing shifts in moods, emotions, and thoughts. All mothers are at risk of developing postpartum depression or the "baby blues." These mood changes can occur right after giving birth, or they may occur many months after giving birth. The baby blues or postpartum depression can be mild or severe. Additionally, postpartum depression can go away rather quickly, or it can be a long-term condition.  °CAUSES °Raised hormone levels and the rapid drop in those levels are thought to be a main cause of postpartum depression and the baby blues. A number of hormones change during and after pregnancy. Estrogen and progesterone usually decrease right after the delivery of your baby. The levels of thyroid hormone and various cortisol steroids also rapidly drop. Other factors that play a role in these mood changes include major life events and genetics.  °RISK FACTORS °If you have any of the following risks for the baby blues or postpartum depression, know what symptoms to watch out for during the postpartum period. Risk factors that may increase the likelihood of getting the baby blues or postpartum depression include: °· Having a personal or family history of depression.   °· Having depression while being pregnant.   °· Having premenstrual mood issues or mood issues related to oral contraceptives. °· Having a lot of life stress.   °· Having marital conflict.   °· Lacking a social support network.   °· Having a baby with special needs.   °· Having health problems, such as diabetes.   °SIGNS AND SYMPTOMS °Symptoms of baby blues  include: °· Brief changes in mood, such as going from extreme happiness to sadness. °· Decreased concentration.   °· Difficulty sleeping.   °· Crying spells, tearfulness.   °· Irritability.   °· Anxiety.   °Symptoms of postpartum depression typically begin within the first month after giving birth. These symptoms include: °· Difficulty sleeping or excessive sleepiness.   °· Marked weight loss.   °· Agitation.   °· Feelings of worthlessness.   °· Lack of interest in activity or food.   °Postpartum psychosis is a very serious condition and can be dangerous. Fortunately, it is rare. Displaying any of the following symptoms is cause for immediate medical attention. Symptoms of postpartum psychosis include:  °· Hallucinations and delusions.   °· Bizarre or disorganized behavior.   °· Confusion or disorientation.   °DIAGNOSIS  °A diagnosis is made by an evaluation of your symptoms. There are no medical or lab tests that lead to a diagnosis, but there are various questionnaires that a health care provider may use to identify those with the baby blues, postpartum depression, or psychosis. Often, a screening tool called the Edinburgh Postnatal Depression Scale is used to diagnose depression in the postpartum period.  °TREATMENT °The baby blues usually goes away on its own in 1-2 weeks. Social support is often all that is needed. You will be encouraged to get adequate sleep and rest. Occasionally, you may be given medicines to help you sleep.  °Postpartum depression requires treatment because it can last several months or longer if it is not treated. Treatment may include individual or group therapy, medicine, or both to address any social, physiological, and psychological   factors that may play a role in the depression. Regular exercise, a healthy diet, rest, and social support may also be strongly recommended.  °Postpartum psychosis is more serious and needs treatment right away. Hospitalization is often needed. °HOME CARE  INSTRUCTIONS °· Get as much rest as you can. Nap when the baby sleeps.   °· Exercise regularly. Some women find yoga and walking to be beneficial.   °· Eat a balanced and nourishing diet.   °· Do little things that you enjoy. Have a cup of tea, take a bubble bath, read your favorite magazine, or listen to your favorite music. °· Avoid alcohol.   °· Ask for help with household chores, cooking, grocery shopping, or running errands as needed. Do not try to do everything.   °· Talk to people close to you about how you are feeling. Get support from your partner, family members, friends, or other new moms. °· Try to stay positive in how you think. Think about the things you are grateful for.   °· Do not spend a lot of time alone.   °· Only take over-the-counter or prescription medicine as directed by your health care provider. °· Keep all your postpartum appointments.   °· Let your health care provider know if you have any concerns.   °SEEK MEDICAL CARE IF: °You are having a reaction to or problems with your medicine. °SEEK IMMEDIATE MEDICAL CARE IF: °· You have suicidal feelings.   °· You think you may harm the baby or someone else. °MAKE SURE YOU: °· Understand these instructions. °· Will watch your condition. °· Will get help right away if you are not doing well or get worse. °Document Released: 06/14/2004 Document Revised: 09/15/2013 Document Reviewed: 06/22/2013 °ExitCare® Patient Information ©2015 [employer]. This information is not intended to replace advice given to you by your health care provider. Make sure you discuss any questions you have with your health care provider. °Postpartum Care After Cesarean Delivery °After you deliver your newborn (postpartum period), the usual stay in the hospital is 24-72 hours. If there were problems with your labor or delivery, or if you have other medical problems, you might be in the hospital longer.  °While you are in the hospital, you will receive help and instructions  on how to care for yourself and your newborn during the postpartum period.  °While you are in the hospital: °· It is normal for you to have pain or discomfort from the incision in your abdomen. Be sure to tell your nurses when you are having pain, where the pain is located, and what makes the pain worse. °· If you are breastfeeding, you may feel uncomfortable contractions of your uterus for a couple of weeks. This is normal. The contractions help your uterus get back to normal size. °· It is normal to have some bleeding after delivery. °· For the first 1-3 days after delivery, the flow is red and the amount may be similar to a period. °· It is common for the flow to start and stop. °· In the first few days, you may pass some small clots. Let your nurses know if you begin to pass large clots or your flow increases. °· Do not  flush blood clots down the toilet before having the nurse look at them. °· During the next 3-10 days after delivery, your flow should become more watery and pink or brown-tinged in color. °· Ten to fourteen days after delivery, your flow should be a small amount of yellowish-white discharge. °· The amount of your flow   will decrease over the first few weeks after delivery. Your flow may stop in 6-8 weeks. Most women have had their flow stop by 12 weeks after delivery. °· You should change your sanitary pads frequently. °· Wash your hands thoroughly with soap and water for at least 20 seconds after changing pads, using the toilet, or before holding or feeding your newborn. °· Your intravenous (IV) tubing will be removed when you are drinking enough fluids. °· The urine drainage tube (urinary catheter) that was inserted before delivery may be removed within 6-8 hours after delivery or when feeling returns to your legs. You should feel like you need to empty your bladder within the first 6-8 hours after the catheter has been removed. °· In case you become weak, lightheaded, or faint, call your nurse  before you get out of bed for the first time and before you take a shower for the first time. °· Within the first few days after delivery, your breasts may begin to feel tender and full. This is called engorgement. Breast tenderness usually goes away within 48-72 hours after engorgement occurs. You may also notice milk leaking from your breasts. If you are not breastfeeding, do not stimulate your breasts. Breast stimulation can make your breasts produce more milk. °· Spending as much time as possible with your newborn is very important. During this time, you and your newborn can feel close and get to know each other. Having your newborn stay in your room (rooming in) will help to strengthen the bond with your newborn. It will give you time to get to know your newborn and become comfortable caring for your newborn. °· Your hormones change after delivery. Sometimes the hormone changes can temporarily cause you to feel sad or tearful. These feelings should not last more than a few days. If these feelings last longer than that, you should talk to your caregiver. °· If desired, talk to your caregiver about methods of family planning or contraception. °· Talk to your caregiver about immunizations. Your caregiver may want you to have the following immunizations before leaving the hospital: °· Tetanus, diphtheria, and pertussis (Tdap) or tetanus and diphtheria (Td) immunization. It is very important that you and your family (including grandparents) or others caring for your newborn are up-to-date with the Tdap or Td immunizations. The Tdap or Td immunization can help protect your newborn from getting ill. °· Rubella immunization. °· Varicella (chickenpox) immunization. °· Influenza immunization. You should receive this annual immunization if you did not receive the immunization during your pregnancy. °Document Released: 06/04/2012 Document Reviewed: 06/04/2012 °ExitCare® Patient Information ©2015 [employer]. This  information is not intended to replace advice given to you by your health care provider. Make sure you discuss any questions you have with your health care provider. ° °

## 2014-08-23 ENCOUNTER — Telehealth: Payer: Self-pay | Admitting: *Deleted

## 2014-08-23 DIAGNOSIS — I739 Peripheral vascular disease, unspecified: Secondary | ICD-10-CM

## 2014-08-23 DIAGNOSIS — B370 Candidal stomatitis: Secondary | ICD-10-CM

## 2014-08-23 MED ORDER — FLUCONAZOLE 150 MG PO TABS: 150.0000 mg | ORAL_TABLET | Freq: Once | ORAL | Status: DC

## 2014-08-23 MED ORDER — NIFEDIPINE ER OSMOTIC RELEASE 30 MG PO TB24
30.0000 mg | ORAL_TABLET | Freq: Two times a day (BID) | ORAL | Status: DC
Start: 2014-08-23 — End: 2014-09-27

## 2014-08-23 NOTE — Telephone Encounter (Signed)
Opened in error

## 2014-08-23 NOTE — Telephone Encounter (Signed)
Pt left message stating that she has met with the Lactation Consultant because she is having nipple vasospasms and breastfeeding is painful. She is requesting Rx for one month course of Procardia which was recommended by the [facility] [place]. Pt left second message on 11/29 @ 1159 stating that she now also has breast thrush and requests Rx of Diflucan.

## 2014-08-23 NOTE — Telephone Encounter (Signed)
Spoke with Dr Debroah LoopArnold who ordered procardia xl 30mg  BID x 1 month and diflucan 150mg  x1. Called patient and informed her of medications sent to pharmacy. Patient verbalized understanding and states that when she has got this in the past she had to be on diflucan for like 3 weeks. Told patient to try the one dose and when she comes in to see us on Wednesday if it's not better she can discuss it with the doctor then. Patient verbalized understanding and had no other questions

## 2014-08-25 ENCOUNTER — Ambulatory Visit (INDEPENDENT_AMBULATORY_CARE_PROVIDER_SITE_OTHER): Payer: Medicaid Other | Admitting: Family Medicine

## 2014-08-25 ENCOUNTER — Encounter: Payer: Self-pay | Admitting: Family Medicine

## 2014-08-25 VITALS — BP 102/62 | HR 76 | Temp 98.2°F | Ht 64.0 in | Wt 124.0 lb

## 2014-08-25 DIAGNOSIS — O91019 Infection of nipple associated with pregnancy, unspecified trimester: Secondary | ICD-10-CM

## 2014-08-25 DIAGNOSIS — B49 Unspecified mycosis: Secondary | ICD-10-CM

## 2014-08-25 DIAGNOSIS — O9102 Infection of nipple associated with the puerperium: Principal | ICD-10-CM

## 2014-08-25 DIAGNOSIS — F419 Anxiety disorder, unspecified: Secondary | ICD-10-CM

## 2014-08-25 DIAGNOSIS — B3789 Other sites of candidiasis: Secondary | ICD-10-CM

## 2014-08-25 MED ORDER — FLUCONAZOLE 150 MG PO TABS: 150.0000 mg | ORAL_TABLET | Freq: Every day | ORAL | Status: DC

## 2014-08-25 NOTE — Progress Notes (Signed)
Took one dose of Diflucan not helping in the past had to be on medication for 3 weeks. Rockwell GermanyLauren Fields, LPN---Triage Complete

## 2014-08-25 NOTE — Progress Notes (Signed)
   Subjective:    Patient ID: Barbara McmurrayBarbara L Tyler, female    DOB: [birth year], 38 y.o.   MRN: [id number]  HPI Seen for follow up of anxiety.  Has been on Zoloft 50mg  daily for 2 weeks.  Was placed on it postpartum due to anxiety.  Reports no symptoms.  Has improvement of anxiety.  Continues to have some anxiety.  No HI/SI.    Also has yeast infection of nipple.   Review of Systems  Constitutional: Negative for fever, chills and fatigue.  Genitourinary: Positive for vaginal bleeding. Negative for dysuria, decreased urine volume, vaginal discharge and vaginal pain.  Neurological: Negative for tremors, light-headedness, numbness and headaches.  Psychiatric/Behavioral: Negative for behavioral problems and agitation.       Objective:   Physical Exam  Constitutional: She is oriented to person, place, and time. She appears well-developed and well-nourished.  HENT:  Head: Normocephalic and atraumatic.  Cardiovascular: Normal rate and regular rhythm.   Pulmonary/Chest: Effort normal and breath sounds normal. No respiratory distress. She has no wheezes. She has no rales. She exhibits no tenderness.  erythemic nipple with pain  Neurological: She is alert and oriented to person, place, and time.  Psychiatric: She has a normal mood and affect. Her behavior is normal. Judgment and thought content normal.       Assessment & Plan:   Problem List Items Addressed This Visit    None    Visit Diagnoses    Yeast infection of nipple, postpartum    -  Primary    Relevant Medications       fluconazole (DIFLUCAN) tablet 150 mg    Anxiety          Diflucan prescribed. Continue zoloft.  May increase if she doesn't see any more improvement over next couple of weeks.

## 2014-08-27 ENCOUNTER — Encounter: Payer: Self-pay | Admitting: Obstetrics & Gynecology

## 2014-09-02 ENCOUNTER — Telehealth: Payer: Self-pay | Admitting: General Practice

## 2014-09-02 DIAGNOSIS — B3789 Other sites of candidiasis: Secondary | ICD-10-CM

## 2014-09-02 DIAGNOSIS — O9102 Infection of nipple associated with the puerperium: Principal | ICD-10-CM

## 2014-09-02 MED ORDER — FLUCONAZOLE 150 MG PO TABS: 150.0000 mg | ORAL_TABLET | Freq: Every day | ORAL | Status: DC

## 2014-09-02 NOTE — Telephone Encounter (Signed)
Patient called and left message stating her doctor is normally Dr Adrian BlackwaterStinson and the last time she saw him, he put her on 2 weeks of diflucan for nipple thrush. Patient states she saw her LC today and they recommended another week of diflucan and she would like to know if we can send this in for her. Per Dr Erin FullingHarraway Smith okay for another week of diflucan. Called patient and informed her of Rx sent to pharmacy. Patient verbalized understanding and had no other questions

## 2014-09-03 ENCOUNTER — Telehealth: Payer: Self-pay | Admitting: *Deleted

## 2014-09-03 NOTE — Telephone Encounter (Addendum)
Pt left message stating that her feet and ankles are swollen and she wants to be know if this is normal. She was sitting a lot yesterday and says it might be from that.  She also wants to know if it could be from a medication side effect.  I returned pt's call and discussed her concern.  I stated that the swelling is unlikely being caused by her medications. I advised her to drink plenty of water daily and move about the house or take short walks outside if possible. When sitting she can do some passive exercises of her feet and lower legs.  Pt voiced understanding.

## 2014-09-13 ENCOUNTER — Telehealth: Payer: Self-pay

## 2014-09-13 DIAGNOSIS — B3789 Other sites of candidiasis: Secondary | ICD-10-CM

## 2014-09-13 DIAGNOSIS — O9102 Infection of nipple associated with the puerperium: Principal | ICD-10-CM

## 2014-09-13 MED ORDER — FLUCONAZOLE 150 MG PO TABS: 150.0000 mg | ORAL_TABLET | Freq: Every day | ORAL | Status: DC

## 2014-09-13 NOTE — Telephone Encounter (Signed)
Pt called and stated that she needed another Rx for diflucan because her one month old has thrush.  Pt stated that the baby is currently being treated but that she needs another dose.   Called pt and advised pt that another diflucan 150 mg tablet has been prescribed for 7 days.  I confirmed with patient that baby was being treated as well.  Pt informed me that baby was going to see the pediatrician today and was told baby was going to get a Rx for thrush for 3 weeks.  Pt asked if she needs another Rx then will she just need to call the office.  I advised pt to call the office if it has not been treated.  Pt stated understanding with no further questions.

## 2014-09-20 ENCOUNTER — Telehealth: Payer: Self-pay

## 2014-09-20 DIAGNOSIS — O9102 Infection of nipple associated with the puerperium: Principal | ICD-10-CM

## 2014-09-20 DIAGNOSIS — B3789 Other sites of candidiasis: Secondary | ICD-10-CM

## 2014-09-20 MED ORDER — FLUCONAZOLE 150 MG PO TABS: 150.0000 mg | ORAL_TABLET | Freq: Every day | ORAL | Status: DC

## 2014-09-20 NOTE — Telephone Encounter (Signed)
Pt called and stated that the needed another refill on diflucan because her baby is still being treated for thrush and they are passing it to one another.  Per Dr. Loreta AveAcosta, pt can have another diflucan prescription with one refill. Called pt and informed pt hat another prescription of diflucan has been sent to her pharmacy and that there is one refill that she can call [facility] pharmacy for if she needs it.  Pt stated "thank you" with no further questions.

## 2014-09-27 ENCOUNTER — Encounter: Payer: Self-pay | Admitting: Obstetrics & Gynecology

## 2014-09-27 ENCOUNTER — Ambulatory Visit (INDEPENDENT_AMBULATORY_CARE_PROVIDER_SITE_OTHER): Payer: Medicaid Other | Admitting: Obstetrics & Gynecology

## 2014-09-27 VITALS — BP 104/71 | HR 85 | Temp 98.2°F | Ht 64.0 in | Wt 125.7 lb

## 2014-09-27 DIAGNOSIS — O99345 Other mental disorders complicating the puerperium: Secondary | ICD-10-CM

## 2014-09-27 DIAGNOSIS — F418 Other specified anxiety disorders: Principal | ICD-10-CM

## 2014-09-27 MED ORDER — SERTRALINE HCL 100 MG PO TABS: 100.0000 mg | ORAL_TABLET | Freq: Every day | ORAL | Status: DC

## 2014-09-27 NOTE — Progress Notes (Signed)
Patient ID: Barbara Tyler, female   DOB: 1975-12-21, 39 y.o.   MRN: [id number] Subjective:needs increased Zoloft for anxiety     Barbara Tyler is a 39 y.o. female who presents for a postpartum visit. She is 7 weeks postpartum following a spontaneous vaginal delivery. I have fully reviewed the prenatal and intrapartum course. The delivery was at 39 gestational weeks. Outcome: spontaneous vaginal delivery. Anesthesia: none. Postpartum course has been good. Baby's course has been good. Baby is feeding by breast. Bleeding no bleeding. Bowel function is normal. Bladder function is normal. Patient is not sexually active. Contraception method is condoms. Postpartum depression screening: negative.  The following portions of the patient's history were reviewed and updated as appropriate: allergies, current medications, past family history, past medical history, past social history, past surgical history and problem list.  Review of Systems mild anxiety    Objective:    BP 104/71 mmHg  Pulse 85  Temp(Src) 98.2 F (36.8 C) (Oral)  Ht  (1.626 m)  Wt 125 lb 11.2 oz (57.017 kg)  BMI 21.57 kg/m2  Breastfeeding? Yes  General:  alert, cooperative and no distress   Breasts:     Lungs:    Heart:     Abdomen: soft, non-tender; bowel sounds normal; no masses,  no organomegaly   Vulva:  not evaluated  Vagina: not evaluated  Cervix:     Corpus: not examined  Adnexa:  not evaluated  Rectal Exam: Not performed.        Assessment:     normal postpartum exam. Pap smear not done at today's visit.   Plan:    1. Contraception: condoms 2. Zoloft 100 mg daily 3. Follow up  as needed. Will see PCP  Adam Phenix, MD 09/27/2014

## 2015-03-01 ENCOUNTER — Telehealth: Payer: Self-pay | Admitting: General Practice

## 2015-03-01 NOTE — Telephone Encounter (Signed)
Called patient to find out who she is seeing for her thyroid as far as endocrinology. No answer-left message with that we are trying to reach you, just have a few questions for you if you could call us back at the clinics. Whenever we hear from patient, need to inform Dr Penne LashLeggett

## 2015-03-02 NOTE — Telephone Encounter (Signed)
Called pt and LM if she could return the call to the Clinics it's concerning follow up.

## 2015-03-07 NOTE — Telephone Encounter (Signed)
Called patient, no answer- left message stating we are trying to reach you, we have a couple questions to ask you if you could call us back at the clinics

## 2016-04-10 ENCOUNTER — Ambulatory Visit (INDEPENDENT_AMBULATORY_CARE_PROVIDER_SITE_OTHER): Payer: Medicaid Other | Admitting: Psychiatry

## 2016-04-10 ENCOUNTER — Encounter (HOSPITAL_COMMUNITY): Payer: Self-pay | Admitting: Psychiatry

## 2016-04-10 VITALS — BP 110/62 | HR 85 | Ht 63.0 in | Wt 130.0 lb

## 2016-04-10 DIAGNOSIS — F4001 Agoraphobia with panic disorder: Secondary | ICD-10-CM

## 2016-04-10 DIAGNOSIS — F331 Major depressive disorder, recurrent, moderate: Secondary | ICD-10-CM | POA: Diagnosis not present

## 2016-04-10 MED ORDER — VENLAFAXINE HCL 37.5 MG PO TABS: 37.5000 mg | ORAL_TABLET | Freq: Every day | ORAL | Status: DC
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
## 2016-05-01 ENCOUNTER — Ambulatory Visit (INDEPENDENT_AMBULATORY_CARE_PROVIDER_SITE_OTHER): Payer: Medicaid Other | Admitting: Psychiatry

## 2016-05-01 ENCOUNTER — Encounter (HOSPITAL_COMMUNITY): Payer: Self-pay | Admitting: Psychiatry

## 2016-05-01 VITALS — BP 114/62 | HR 90 | Ht 63.0 in | Wt 130.8 lb

## 2016-05-01 DIAGNOSIS — F331 Major depressive disorder, recurrent, moderate: Secondary | ICD-10-CM | POA: Diagnosis not present

## 2016-05-01 DIAGNOSIS — F4001 Agoraphobia with panic disorder: Secondary | ICD-10-CM | POA: Diagnosis not present

## 2016-05-01 MED ORDER — SERTRALINE HCL 100 MG PO TABS: 200.0000 mg | ORAL_TABLET | Freq: Every day | ORAL | 0 refills | Status: DC

## 2016-05-01 MED ORDER — VENLAFAXINE HCL 37.5 MG PO TABS: 37.5000 mg | ORAL_TABLET | Freq: Every day | ORAL | 1 refills | Status: DC
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
## 2016-05-10 ENCOUNTER — Ambulatory Visit (INDEPENDENT_AMBULATORY_CARE_PROVIDER_SITE_OTHER): Payer: Medicaid Other | Admitting: Licensed Clinical Social Worker

## 2016-05-10 DIAGNOSIS — F410 Panic disorder [episodic paroxysmal anxiety] without agoraphobia: Secondary | ICD-10-CM | POA: Diagnosis not present

## 2016-05-10 DIAGNOSIS — F439 Reaction to severe stress, unspecified: Secondary | ICD-10-CM | POA: Diagnosis not present

## 2016-05-10 DIAGNOSIS — F4310 Post-traumatic stress disorder, unspecified: Secondary | ICD-10-CM | POA: Diagnosis not present

## 2016-05-10 DIAGNOSIS — F938 Other childhood emotional disorders: Secondary | ICD-10-CM

## 2016-05-10 DIAGNOSIS — Z639 Problem related to primary support group, unspecified: Secondary | ICD-10-CM

## 2016-05-11 DIAGNOSIS — F439 Reaction to severe stress, unspecified: Secondary | ICD-10-CM | POA: Insufficient documentation

## 2016-05-11 DIAGNOSIS — Z639 Problem related to primary support group, unspecified: Secondary | ICD-10-CM | POA: Insufficient documentation

## 2016-05-11 DIAGNOSIS — F410 Panic disorder [episodic paroxysmal anxiety] without agoraphobia: Secondary | ICD-10-CM | POA: Insufficient documentation
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
## 2016-06-26 ENCOUNTER — Ambulatory Visit (HOSPITAL_COMMUNITY): Payer: Self-pay | Admitting: Psychiatry

## 2016-10-15 ENCOUNTER — Encounter (HOSPITAL_COMMUNITY): Payer: Self-pay | Admitting: Licensed Clinical Social Worker

## 2016-11-05 ENCOUNTER — Ambulatory Visit (HOSPITAL_COMMUNITY): Payer: Self-pay | Admitting: Licensed Clinical Social Worker

## 2016-11-28 ENCOUNTER — Ambulatory Visit (HOSPITAL_COMMUNITY): Payer: Self-pay | Admitting: Licensed Clinical Social Worker

## 2018-05-21 ENCOUNTER — Emergency Department (HOSPITAL_COMMUNITY)
Admission: EM | Admit: 2018-05-21 | Discharge: 2018-05-22 | Disposition: A | Discharge status: Other Acute Inpatient Hospital | Payer: Medicaid Other | Attending: Emergency Medicine | Admitting: Emergency Medicine

## 2018-05-21 DIAGNOSIS — Y999 Unspecified external cause status: Secondary | ICD-10-CM | POA: Insufficient documentation

## 2018-05-21 DIAGNOSIS — Y929 Unspecified place or not applicable: Secondary | ICD-10-CM | POA: Insufficient documentation

## 2018-05-21 DIAGNOSIS — S51812A Laceration without foreign body of left forearm, initial encounter: Secondary | ICD-10-CM | POA: Insufficient documentation

## 2018-05-21 DIAGNOSIS — T1491XA Suicide attempt, initial encounter: Secondary | ICD-10-CM | POA: Insufficient documentation

## 2018-05-21 DIAGNOSIS — X789XXA Intentional self-harm by unspecified sharp object, initial encounter: Secondary | ICD-10-CM | POA: Diagnosis not present

## 2018-05-21 DIAGNOSIS — D682 Hereditary deficiency of other clotting factors: Secondary | ICD-10-CM | POA: Diagnosis not present

## 2018-05-21 DIAGNOSIS — Z79899 Other long term (current) drug therapy: Secondary | ICD-10-CM | POA: Insufficient documentation

## 2018-05-21 DIAGNOSIS — Y939 Activity, unspecified: Secondary | ICD-10-CM | POA: Insufficient documentation

## 2018-05-21 DIAGNOSIS — F329 Major depressive disorder, single episode, unspecified: Secondary | ICD-10-CM | POA: Diagnosis not present

## 2018-05-21 MED ORDER — LIDOCAINE-EPINEPHRINE (PF) 2 %-1:200000 IJ SOLN
20.0000 mL | Freq: Once | INTRAMUSCULAR | Status: AC
  Administered 2018-05-22: 20 mL
  Filled 2018-05-21: qty 20

## 2018-05-21 MED ORDER — TETANUS-DIPHTH-ACELL PERTUSSIS 5-2.5-18.5 LF-MCG/0.5 IM SUSP: 0.5000 mL | Freq: Once | INTRAMUSCULAR | Status: DC

## 2018-05-21 NOTE — ED Provider Notes (Signed)
[facility]-EMERGENCY DEPT Provider Note   CSN: [id number] Arrival date & time: 05/21/18  2340     History   Chief Complaint Chief Complaint  Patient presents with  . Suicidal  . Self inflicted laceration left forearm  . Intoxicated    HPI Barbara Tyler is a 42 y.o. female.  Patient presents to the emergency department for evaluation of self-inflicted laceration to left forearm.  Patient admits to depression and suicidal ideation.  Self-inflicted wound was a suicide attempt.  Patient evasive, not answering most questions. Level V Caveat due to psych.     Past Medical History:  Diagnosis Date  . Abnormal Pap smear    cryo, clean since  . Depression   . Factor 5 Leiden mutation, heterozygous (HCC)   . Hypothyroidism   . Infection    UTI  . Kidney stones   . Preterm labor     Patient Active Problem List   Diagnosis Date Noted  . Panic disorder 05/11/2016  . 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 Relationship problems 05/11/2016  . Short cervix, antepartum 04/22/2014  . Pain, dental 01/06/2014  . Subclinical hypothyroidism 04/01/2013  . Heterozygous factor V Leiden mutation (HCC) 04/01/2013  . History of recurrent spontaneous abortion, not currently pregnant 03/23/2013  . Depression 03/23/2013    Past Surgical History:  Procedure Laterality Date  . CERVICAL CERCLAGE    . CERVICAL CERCLAGE N/A 04/23/2014   Procedure: CERCLAGE CERVICAL;  Surgeon: Reva Bores, MD;  Location: [facility];  Service: Gynecology;  Laterality: N/A;  . CRYOTHERAPY    . DILATION AND EVACUATION N/A 02/27/2013   Procedure: DILATATION AND EVACUATION;  Surgeon: Reva Bores, MD;  Location: [facility];  Service: Gynecology;  Laterality: N/A;  . NO PAST SURGERIES       OB History    Gravida  7   Para  3   Term  2   Preterm  1   AB  4   Living  3     SAB  4   TAB      Ectopic      Multiple  0   Live Births  3            Home  Medications    Prior to Admission medications   Medication Sig Start Date End Date Taking? Authorizing Provider  levothyroxine (SYNTHROID, LEVOTHROID) 75 MCG tablet Take 75 mcg by mouth daily before breakfast.   Yes [provider]  sertraline (ZOLOFT) 100 MG tablet Take 2 tablets (200 mg total) by mouth daily. 05/01/16  Yes Thresa Ross, MD  venlafaxine (EFFEXOR) 37.5 MG tablet Take 1 tablet (37.5 mg total) by mouth daily. Patient not taking: Reported on 05/22/2018 05/01/16   Thresa Ross, MD    Family History Family History  Problem Relation Age of Onset  . Cancer Father   . Cancer Maternal Grandmother        breast- great grandma    Social History Social History   Tobacco Use  . Smoking status: Never Smoker  . Smokeless tobacco: Never Used  Substance Use Topics  . Alcohol use: Yes    Alcohol/week: 4.0 standard drinks    Types: 4 Cans of beer per week    Comment: rare  . Drug use: No     Allergies   Latex   Review of Systems Review of Systems  Unable to perform ROS: Psychiatric disorder     Physical Exam Updated Vital  Signs BP (!) 92/59   Pulse 87   Temp 98.3 F (36.8 C) (Oral)   Resp 15   Ht 5\' 4"  (1.626 m)   Wt 56.7 kg   LMP 04/23/2018 (Approximate)   SpO2 98%   BMI 21.46 kg/m   Physical Exam  Constitutional: She is oriented to person, place, and time. She appears well-developed and well-nourished. No distress.  HENT:  Head: Normocephalic and atraumatic.  Right Ear: Hearing normal.  Left Ear: Hearing normal.  Nose: Nose normal.  Mouth/Throat: Oropharynx is clear and moist and mucous membranes are normal.  Eyes: Pupils are equal, round, and reactive to light. Conjunctivae and EOM are normal.  Neck: Normal range of motion. Neck supple.  Cardiovascular: Regular rhythm, S1 normal and S2 normal. Exam reveals no gallop and no friction rub.  No murmur heard. Pulmonary/Chest: Effort normal and breath sounds normal. No respiratory distress. She  exhibits no tenderness.  Abdominal: Soft. Normal appearance and bowel sounds are normal. There is no hepatosplenomegaly. There is no tenderness. There is no rebound, no guarding, no tenderness at McBurney's point and negative Murphy's sign. No hernia.  Musculoskeletal: Normal range of motion.  Neurological: She is alert and oriented to person, place, and time. She has normal strength. No cranial nerve deficit or sensory deficit. Coordination normal. GCS eye subscore is 4. GCS verbal subscore is 5. GCS motor subscore is 6.  Skin: Skin is warm, dry and intact. No rash noted. No cyanosis.  3 linear lacerations proximal left forearm  Psychiatric: Her speech is delayed. She is slowed and withdrawn. She exhibits a depressed mood. She expresses suicidal ideation. She is noncommunicative.  Nursing note and vitals reviewed.      ED Treatments / Results  Labs (all labs ordered are listed, but only abnormal results are displayed) Labs Reviewed  COMPREHENSIVE METABOLIC PANEL - Abnormal; Notable for the following components:      Result Value   Calcium 8.8 (*)    All other components within normal limits  ETHANOL - Abnormal; Notable for the following components:   Alcohol, Ethyl (B) 164 (*)    All other components within normal limits  ACETAMINOPHEN LEVEL - Abnormal; Notable for the following components:   Acetaminophen (Tylenol), Serum <10 (*)    All other components within normal limits  SALICYLATE LEVEL  CBC  RAPID URINE DRUG SCREEN, HOSP PERFORMED  I-STAT BETA HCG BLOOD, ED ([facility], [facility], [facility] ONLY)    EKG None  Radiology No results found.  Procedures .Marland Kitchen.Laceration Repair Date/Time: 05/22/2018 6:16 AM Performed by: Gilda CreasePollina, Shameca Landen J, MD Authorized by: Gilda CreasePollina, Lina Hitch J, MD   Consent:    Consent obtained:  Emergent situation Universal protocol:    Site/side marked: yes     Immediately prior to procedure, a time out was called: yes     Patient identity confirmed:   Hospital-assigned identification number Anesthesia (see MAR for exact dosages):    Anesthesia method:  Local infiltration   Local anesthetic:  Lidocaine 2% WITH epi Laceration details:    Location:  Shoulder/arm   Shoulder/arm location:  L lower arm   Length (cm):  3 Repair type:    Repair type:  Simple Pre-procedure details:    Preparation:  Patient was prepped and draped in usual sterile fashion Exploration:    Hemostasis achieved with:  Epinephrine   Wound exploration: entire depth of wound probed and visualized     Contaminated: no   Treatment:    Area cleansed with:  Betadine  Irrigation solution:  Sterile saline   Irrigation method:  Syringe Skin repair:    Repair method:  Sutures   Suture size:  3-0   Suture material:  Nylon   Suture technique:  Simple interrupted   Number of sutures:  4 Approximation:    Approximation:  Close .Marland KitchenLaceration Repair Date/Time: 05/22/2018 6:19 AM Performed by: Gilda Crease, MD Authorized by: Gilda Crease, MD   Consent:    Consent obtained:  Emergent situation Universal protocol:    Site/side marked: yes     Immediately prior to procedure, a time out was called: yes     Patient identity confirmed:  Hospital-assigned identification number Anesthesia (see MAR for exact dosages):    Anesthesia method:  Local infiltration   Local anesthetic:  Lidocaine 2% WITH epi Laceration details:    Location:  Shoulder/arm   Shoulder/arm location:  L lower arm   Length (cm):  3.5 Repair type:    Repair type:  Simple Pre-procedure details:    Preparation:  Patient was prepped and draped in usual sterile fashion Exploration:    Hemostasis achieved with:  Epinephrine   Wound exploration: entire depth of wound probed and visualized     Contaminated: no   Treatment:    Area cleansed with:  Betadine   Irrigation solution:  Sterile saline   Irrigation method:  Syringe Skin repair:    Repair method:  Sutures   Suture size:   3-0   Suture material:  Nylon   Suture technique:  Simple interrupted   Number of sutures:  5 Approximation:    Approximation:  Close .Marland KitchenLaceration Repair Date/Time: 05/22/2018 6:20 AM Performed by: Gilda Crease, MD Authorized by: Gilda Crease, MD   Consent:    Consent obtained:  Emergent situation Universal protocol:    Site/side marked: yes     Immediately prior to procedure, a time out was called: yes     Patient identity confirmed:  Hospital-assigned identification number Anesthesia (see MAR for exact dosages):    Anesthesia method:  Local infiltration   Local anesthetic:  Lidocaine 2% WITH epi Laceration details:    Location:  Shoulder/arm   Shoulder/arm location:  L lower arm   Length (cm):  3.5 Repair type:    Repair type:  Simple Pre-procedure details:    Preparation:  Patient was prepped and draped in usual sterile fashion Exploration:    Hemostasis achieved with:  Epinephrine   Wound exploration: entire depth of wound probed and visualized     Contaminated: no   Treatment:    Area cleansed with:  Betadine   Irrigation solution:  Sterile saline   Irrigation method:  Syringe Skin repair:    Repair method:  Sutures   Suture size:  3-0   Suture material:  Nylon   Suture technique:  Simple interrupted   Number of sutures:  5 Approximation:    Approximation:  Close   (including critical care time)  Medications Ordered in ED Medications  bacitracin ointment (has no administration in time range)  bacitracin 500 UNIT/GM ointment (has no administration in time range)  lidocaine-EPINEPHrine (XYLOCAINE W/EPI) 2 %-1:200000 (PF) injection 20 mL (20 mLs Infiltration Given 05/22/18 0115)  ziprasidone (GEODON) injection 20 mg (20 mg Intramuscular Given 05/22/18 0114)  LORazepam (ATIVAN) injection 1 mg (1 mg Intramuscular Given 05/22/18 0115)  sterile water (preservative free) injection (2.4 mLs  Given 05/22/18 0114)     Initial Impression / Assessment  and Plan / ED Course  I have reviewed the triage vital signs and the nursing notes.  Pertinent labs & imaging results that were available during my care of the patient were reviewed by me and considered in my medical decision making (see chart for details).     She presented to the emergency department after suicide attempt.  Patient was agitated and belligerent at arrival.  Patient presented with 3 deep linear lacerations to the volar aspect of the left proximal forearm.  This was into the subcutaneous fat, but did not violate any tendons, nerves or major blood vessels.  She had normal range of motion preserved.    Based on her agitation and the suicide attempt, involuntary commitment was initiated.  Wounds were repaired.  She will require psychiatric evaluation.  Final Clinical Impressions(s) / ED Diagnoses   Final diagnoses:  Suicide attempt [facility])    ED Discharge Orders    None       Blimi Godby, Canary Brim, MD 05/22/18 [phone]

## 2018-05-21 NOTE — ED Notes (Signed)
Bed: WA17 Expected date:  Expected time:  Means of arrival:  Comments: 

## 2018-05-21 NOTE — ED Triage Notes (Signed)
Patient arrives by [facility]-patient has been drinking tonight-states family stressors and inflicted 3 deep lacerations to her left forearm. Patient voiced to EMS she is suicidal-patient at home with a friend and family. Pressure dressing intact to left forearm.

## 2018-05-22 ENCOUNTER — Inpatient Hospital Stay (HOSPITAL_COMMUNITY)
Admission: AD | Admit: 2018-05-22 | Discharge: 2018-05-26 | DRG: 885 | Disposition: A | Discharge status: Home / Self Care | Payer: Medicaid Other | Source: Intra-hospital | Attending: Psychiatry | Admitting: Psychiatry

## 2018-05-22 ENCOUNTER — Encounter (HOSPITAL_COMMUNITY): Payer: Self-pay

## 2018-05-22 ENCOUNTER — Other Ambulatory Visit: Payer: Self-pay

## 2018-05-22 DIAGNOSIS — S51812A Laceration without foreign body of left forearm, initial encounter: Secondary | ICD-10-CM | POA: Diagnosis not present

## 2018-05-22 DIAGNOSIS — E039 Hypothyroidism, unspecified: Secondary | ICD-10-CM | POA: Diagnosis present

## 2018-05-22 DIAGNOSIS — F410 Panic disorder [episodic paroxysmal anxiety] without agoraphobia: Secondary | ICD-10-CM | POA: Diagnosis present

## 2018-05-22 DIAGNOSIS — Z79890 Hormone replacement therapy (postmenopausal): Secondary | ICD-10-CM

## 2018-05-22 DIAGNOSIS — R45851 Suicidal ideations: Secondary | ICD-10-CM | POA: Diagnosis present

## 2018-05-22 DIAGNOSIS — G4700 Insomnia, unspecified: Secondary | ICD-10-CM | POA: Diagnosis present

## 2018-05-22 DIAGNOSIS — Z91040 Latex allergy status: Secondary | ICD-10-CM | POA: Diagnosis not present

## 2018-05-22 DIAGNOSIS — Z6379 Other stressful life events affecting family and household: Secondary | ICD-10-CM | POA: Diagnosis not present

## 2018-05-22 DIAGNOSIS — Z560 Unemployment, unspecified: Secondary | ICD-10-CM | POA: Diagnosis not present

## 2018-05-22 DIAGNOSIS — N390 Urinary tract infection, site not specified: Secondary | ICD-10-CM | POA: Diagnosis present

## 2018-05-22 DIAGNOSIS — Z79899 Other long term (current) drug therapy: Secondary | ICD-10-CM | POA: Diagnosis not present

## 2018-05-22 DIAGNOSIS — F419 Anxiety disorder, unspecified: Secondary | ICD-10-CM | POA: Diagnosis not present

## 2018-05-22 DIAGNOSIS — F332 Major depressive disorder, recurrent severe without psychotic features: Secondary | ICD-10-CM | POA: Diagnosis present

## 2018-05-22 DIAGNOSIS — Y906 Blood alcohol level of 120-199 mg/100 ml: Secondary | ICD-10-CM | POA: Diagnosis not present

## 2018-05-22 DIAGNOSIS — Z635 Disruption of family by separation and divorce: Secondary | ICD-10-CM | POA: Diagnosis not present

## 2018-05-22 DIAGNOSIS — F1010 Alcohol abuse, uncomplicated: Secondary | ICD-10-CM | POA: Diagnosis present

## 2018-05-22 DIAGNOSIS — F10129 Alcohol abuse with intoxication, unspecified: Secondary | ICD-10-CM | POA: Diagnosis present

## 2018-05-22 LAB — ACETAMINOPHEN LEVEL: Acetaminophen (Tylenol), Serum: <10 ug/mL — ABNORMAL LOW (ref 10–30)

## 2018-05-22 LAB — CBC
HCT: 39.1 % (ref 36.0–46.0)
Hemoglobin: 13.4 g/dL (ref 12.0–15.0)
MCH: 32 pg (ref 26.0–34.0)
MCHC: 34.3 g/dL (ref 30.0–36.0)
MCV: 93.3 fL (ref 78.0–100.0)
PLATELETS: 362 10*3/uL (ref 150–400)
RBC: 4.19 MIL/uL (ref 3.87–5.11)
RDW: 12.8 % (ref 11.5–15.5)
WBC: 6.8 10*3/uL (ref 4.0–10.5)

## 2018-05-22 LAB — COMPREHENSIVE METABOLIC PANEL
ALK PHOS: 54 U/L (ref 38–126)
ALT: 19 U/L (ref 0–44)
AST: 20 U/L (ref 15–41)
Albumin: 4.2 g/dL (ref 3.5–5.0)
Anion gap: 11 (ref 5–15)
BUN: 11 mg/dL (ref 6–20)
CALCIUM: 8.8 mg/dL — AB (ref 8.9–10.3)
CO2: 22 mmol/L (ref 22–32)
Chloride: 109 mmol/L (ref 98–111)
Creatinine, Ser: 0.64 mg/dL (ref 0.44–1.00)
GFR calc Af Amer: >60 mL/min (ref >60)
GFR calc non Af Amer: >60 mL/min (ref >60)
Glucose, Bld: 96 mg/dL (ref 70–99)
Potassium: 3.7 mmol/L (ref 3.5–5.1)
SODIUM: 142 mmol/L (ref 135–145)
Total Bilirubin: 0.6 mg/dL (ref 0.3–1.2)
Total Protein: 7.5 g/dL (ref 6.5–8.1)

## 2018-05-22 LAB — I-STAT BETA HCG BLOOD, ED (MC, WL, AP ONLY)

## 2018-05-22 LAB — ETHANOL: Alcohol, Ethyl (B): 164 mg/dL — ABNORMAL HIGH (ref <10)

## 2018-05-22 LAB — SALICYLATE LEVEL: Salicylate Lvl: <7 mg/dL (ref 2.8–30.0)

## 2018-05-22 LAB — TSH: TSH: 1.918 u[IU]/mL (ref 0.350–4.500)

## 2018-05-22 MED ORDER — STERILE WATER FOR INJECTION IJ SOLN
INTRAMUSCULAR | Status: AC
  Administered 2018-05-22: 2.4 mL
  Filled 2018-05-22: qty 10

## 2018-05-22 MED ORDER — CHLORDIAZEPOXIDE HCL 25 MG PO CAPS: 25.0000 mg | ORAL_CAPSULE | Freq: Three times a day (TID) | ORAL | Status: DC | PRN

## 2018-05-22 MED ORDER — LEVOTHYROXINE SODIUM 75 MCG PO TABS
75.0000 ug | ORAL_TABLET | Freq: Every day | ORAL | Status: DC
  Administered 2018-05-23 – 2018-05-26 (×4): 75 ug via ORAL
  Filled 2018-05-22 (×7): qty 1

## 2018-05-22 MED ORDER — MAGNESIUM HYDROXIDE 400 MG/5ML PO SUSP: 30.0000 mL | Freq: Every day | ORAL | Status: DC | PRN

## 2018-05-22 MED ORDER — BACITRACIN ZINC 500 UNIT/GM EX OINT
TOPICAL_OINTMENT | CUTANEOUS | Status: AC
  Administered 2018-05-22: 07:00:00
  Filled 2018-05-22: qty 2.7

## 2018-05-22 MED ORDER — ALUM & MAG HYDROXIDE-SIMETH 200-200-20 MG/5ML PO SUSP: 30.0000 mL | ORAL | Status: DC | PRN

## 2018-05-22 MED ORDER — BACITRACIN ZINC 500 UNIT/GM EX OINT
TOPICAL_OINTMENT | Freq: Two times a day (BID) | CUTANEOUS | Status: DC
  Administered 2018-05-22 (×2): via TOPICAL

## 2018-05-22 MED ORDER — ACETAMINOPHEN 325 MG PO TABS: 650.0000 mg | ORAL_TABLET | Freq: Four times a day (QID) | ORAL | Status: DC | PRN

## 2018-05-22 MED ORDER — FOLIC ACID 1 MG PO TABS
1.0000 mg | ORAL_TABLET | Freq: Every day | ORAL | Status: DC
  Administered 2018-05-22 – 2018-05-26 (×5): 1 mg via ORAL
  Filled 2018-05-22 (×9): qty 1

## 2018-05-22 MED ORDER — VITAMIN B-1 100 MG PO TABS
100.0000 mg | ORAL_TABLET | Freq: Every day | ORAL | Status: DC
  Administered 2018-05-22 – 2018-05-26 (×5): 100 mg via ORAL
  Filled 2018-05-22 (×9): qty 1

## 2018-05-22 MED ORDER — HYDROXYZINE HCL 25 MG PO TABS
25.0000 mg | ORAL_TABLET | Freq: Three times a day (TID) | ORAL | Status: DC | PRN
  Administered 2018-05-22: 25 mg via ORAL
  Filled 2018-05-22: qty 1

## 2018-05-22 MED ORDER — SERTRALINE HCL 100 MG PO TABS
200.0000 mg | ORAL_TABLET | Freq: Every day | ORAL | Status: DC
  Administered 2018-05-22 – 2018-05-26 (×5): 200 mg via ORAL
  Filled 2018-05-22 (×10): qty 2

## 2018-05-22 MED ORDER — TRAZODONE HCL 50 MG PO TABS
50.0000 mg | ORAL_TABLET | Freq: Every evening | ORAL | Status: DC | PRN
  Administered 2018-05-22 – 2018-05-25 (×4): 50 mg via ORAL
  Filled 2018-05-22 (×4): qty 1

## 2018-05-22 MED ORDER — LORAZEPAM 2 MG/ML IJ SOLN
1.0000 mg | Freq: Once | INTRAMUSCULAR | Status: AC
  Administered 2018-05-22: 1 mg via INTRAMUSCULAR
  Filled 2018-05-22: qty 1

## 2018-05-22 MED ORDER — ZIPRASIDONE MESYLATE 20 MG IM SOLR
20.0000 mg | Freq: Once | INTRAMUSCULAR | Status: AC
  Administered 2018-05-22: 20 mg via INTRAMUSCULAR
  Filled 2018-05-22: qty 20

## 2018-05-22 NOTE — ED Notes (Signed)
ED Provider at bedside. 

## 2018-05-22 NOTE — BH Assessment (Signed)
[facility] Assessment Progress Note  Per Leata MouseJanardhana Jonnalagadda, MD, this pt requires psychiatric hospitalization.  Berneice Heinrichina Tate, RN, [facility]  Pt presents under IVC initiated by Jaci Carrelhristopher Pollina, MD, and [facility].  Pt's nurse, Diane, has been notified, and agrees to call report to [phone].  Pt is to be transported via [employer].   Doylene Canninghomas Stevey [place]ap[place], [place] Behavioral Health Coordinator [phone]

## 2018-05-22 NOTE — ED Notes (Signed)
Pt reports overwhelming life stressors at this time with changes in employment, living situation and parenting. Uses cutting to feel some control in her life and did not know that the razor blade was so sharp and accidentally cut herself too deeply.
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
## 2018-05-22 NOTE — Tx Team (Signed)
Initial Treatment Plan 05/22/2018 5:58 PM Barbara Tyler [id number]    PATIENT STRESSORS: Financial difficulties Substance abuse Traumatic event   PATIENT STRENGTHS: Capable of independent living Physical Health   PATIENT IDENTIFIED PROBLEMS: "I don't have any."                     DISCHARGE CRITERIA:  Ability to meet basic life and health needs Improved stabilization in mood, thinking, and/or behavior  PRELIMINARY DISCHARGE PLAN: Outpatient therapy Return to previous living arrangement  PATIENT/FAMILY INVOLVEMENT: This treatment plan has been presented to and reviewed with the patient, Barbara Tyler.  The patient and family have been given the opportunity to ask questions and make suggestions.  Dewayne ShorterAlyssa  Shalanda Brogden, RN 05/22/2018, 5:58 PM

## 2018-05-22 NOTE — ED Notes (Signed)
Patient is sleeping soundly with sitter at bedside in no distress-wound care to sutured areas left inner forearm area-Dr. Blinda LeatherwoodPollina aware of patient's BP-no new orders at this time

## 2018-05-22 NOTE — ED Notes (Addendum)
Pt BP reading 67/45 (automatic) x 3. Reading 70/45 (manual). IV placed and fluids given.

## 2018-05-22 NOTE — Progress Notes (Signed)
Patient states that she had an "okay" day. Her goal for tomorrow is to read some books brought in by her family and to begin reflecting on her behavior prior to being admitted.

## 2018-05-22 NOTE — Plan of Care (Signed)
  Problem: Coping: Goal: Ability to identify and develop effective coping behavior will improve Outcome: Not Progressing   Problem: Self-Concept: Goal: Ability to identify factors that promote anxiety will improve Outcome: Not Progressing   Problem: Self-Concept: Goal: Level of anxiety will decrease Outcome: Not Progressing   Problem: Education: Goal: Utilization of techniques to improve thought processes will improve Outcome: Not Progressing

## 2018-05-22 NOTE — ED Notes (Signed)
Tdap updated 2 years ago

## 2018-05-22 NOTE — ED Notes (Signed)
Patient not compliant with keeping dressing in place-trying to leave-cursing at staff-yelling and crying-administered medications as ordered-sitter at bedside.
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
## 2018-05-22 NOTE — ED Notes (Signed)
Patient has been placed under IVC-Dr. Blinda LeatherwoodPollina at bedside to suture patirnt's wounds

## 2018-05-22 NOTE — ED Notes (Signed)
[facility] [employer] All belongings returned to pt. Dressing changed prior to transport because it was soiled with scant amount of blood. There were 3 lateral wounds on left upper arm, close to the elbow. Stitches were intact. There was no bleeding and did not appear to be any drainage. Pt was calm and cooperative.

## 2018-05-22 NOTE — ED Notes (Signed)
Called [employer] for transport. [facility].

## 2018-05-22 NOTE — ED Notes (Signed)
Patient asleep with sitter at bedside-report to [facility] RN secure unit-patient will go over after she has been sutured
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
## 2018-05-22 NOTE — ED Notes (Signed)
Patient removed pressure bandages x 3 from left forearm-patient is angry and tearful and yelling at staff-assisted back to room and scrubs placed-patient has: -1 silver toned necklace -white t shirt -1 pair grey sweat pants All placed in belonging bag and placed in cabinet for room 16-18

## 2018-05-22 NOTE — ED Notes (Signed)
[id number] Expected date:  Expected time:  Means of arrival:  Comments: Hold room 17

## 2018-05-22 NOTE — ED Notes (Signed)
Patient removed pressure bandage from her left arm "because it hurt"-patient bleeding uncontrollably-pressure bandage reapplied-sitter at bedside

## 2018-05-23 DIAGNOSIS — Y906 Blood alcohol level of 120-199 mg/100 ml: Secondary | ICD-10-CM

## 2018-05-23 DIAGNOSIS — Z635 Disruption of family by separation and divorce: Secondary | ICD-10-CM

## 2018-05-23 DIAGNOSIS — F410 Panic disorder [episodic paroxysmal anxiety] without agoraphobia: Secondary | ICD-10-CM

## 2018-05-23 DIAGNOSIS — F10129 Alcohol abuse with intoxication, unspecified: Secondary | ICD-10-CM | POA: Diagnosis present

## 2018-05-23 LAB — URINALYSIS, ROUTINE W REFLEX MICROSCOPIC
Bilirubin Urine: NEGATIVE
GLUCOSE, UA: NEGATIVE mg/dL
Ketones, ur: NEGATIVE mg/dL
Nitrite: NEGATIVE
PH: 5 (ref 5.0–8.0)
Protein, ur: NEGATIVE mg/dL
RBC / HPF: >50 RBC/hpf — ABNORMAL HIGH (ref 0–5)
SPECIFIC GRAVITY, URINE: 1.016 (ref 1.005–1.030)
WBC, UA: >50 WBC/hpf — ABNORMAL HIGH (ref 0–5)

## 2018-05-23 MED ORDER — SULFAMETHOXAZOLE-TRIMETHOPRIM 400-80 MG PO TABS
1.0000 | ORAL_TABLET | Freq: Two times a day (BID) | ORAL | Status: DC
  Administered 2018-05-23 – 2018-05-26 (×7): 1 via ORAL
  Filled 2018-05-23 (×9): qty 1

## 2018-05-23 MED ORDER — BUSPIRONE HCL 10 MG PO TABS
10.0000 mg | ORAL_TABLET | Freq: Two times a day (BID) | ORAL | Status: DC
  Administered 2018-05-23 – 2018-05-26 (×6): 10 mg via ORAL
  Filled 2018-05-23 (×2): qty 1
  Filled 2018-05-23: qty 2
  Filled 2018-05-23 (×8): qty 1

## 2018-05-23 MED ORDER — PHENAZOPYRIDINE HCL 100 MG PO TABS
100.0000 mg | ORAL_TABLET | Freq: Three times a day (TID) | ORAL | Status: AC | PRN
  Administered 2018-05-23 (×3): 100 mg via ORAL
  Filled 2018-05-23 (×3): qty 1

## 2018-05-23 MED ORDER — BACITRACIN-NEOMYCIN-POLYMYXIN 400-5-5000 EX OINT
TOPICAL_OINTMENT | Freq: Three times a day (TID) | CUTANEOUS | Status: DC
  Administered 2018-05-23 – 2018-05-25 (×5): via TOPICAL
  Administered 2018-05-26 (×2): 1 via TOPICAL
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
## 2018-05-23 NOTE — Tx Team (Signed)
Interdisciplinary Treatment and Diagnostic Plan Update  05/23/2018 Time of Session: [place] MRN: [id number]  Principal Diagnosis: MDD, recurrent, severe, without psychosis  Secondary Diagnoses: Active Problems:   MDD (major depressive disorder), recurrent severe, without psychosis ([place])   Current Medications:  Current Facility-Administered Medications  Medication Dose Route Frequency Provider Last Rate Last Dose  . acetaminophen (TYLENOL) tablet 650 mg  650 mg Oral Q6H PRN Ethelene Hal, NP      . alum & mag hydroxide-simeth (MAALOX/MYLANTA) 200-200-20 MG/5ML suspension 30 mL  30 mL Oral Q4H PRN Ethelene Hal, NP      . chlordiazePOXIDE (LIBRIUM) capsule 25 mg  25 mg Oral TID PRN Sharma Covert, MD      . folic acid (FOLVITE) tablet 1 mg  1 mg Oral Daily Sharma Covert, MD   1 mg at 05/23/18 2993  . hydrOXYzine (ATARAX/VISTARIL) tablet 25 mg  25 mg Oral TID PRN Ethelene Hal, NP   25 mg at 05/22/18 2117  . levothyroxine (SYNTHROID, LEVOTHROID) tablet 75 mcg  75 mcg Oral QAC breakfast Ethelene Hal, NP   75 mcg at 05/23/18 0615  . magnesium hydroxide (MILK OF MAGNESIA) suspension 30 mL  30 mL Oral Daily PRN Ethelene Hal, NP      . neomycin-bacitracin-polymyxin (NEOSPORIN) ointment   Topical TID Sharma Covert, MD      . phenazopyridine (PYRIDIUM) tablet 100 mg  100 mg Oral TID WC PRN Lindon Romp A, NP   100 mg at 05/23/18 7169  . sertraline (ZOLOFT) tablet 200 mg  200 mg Oral Daily Ethelene Hal, NP   200 mg at 05/23/18 6789  . thiamine (VITAMIN B-1) tablet 100 mg  100 mg Oral Daily Sharma Covert, MD   100 mg at 05/23/18 3810  . traZODone (DESYREL) tablet 50 mg  50 mg Oral QHS PRN Ethelene Hal, NP   50 mg at 05/22/18 2117   PTA Medications: Medications Prior to Admission  Medication Sig Dispense Refill Last Dose  . levothyroxine (SYNTHROID, LEVOTHROID) 75 MCG tablet Take 75 mcg by mouth daily  before breakfast.   05/22/2018 at Unknown time  . sertraline (ZOLOFT) 100 MG tablet Take 2 tablets (200 mg total) by mouth daily. 60 tablet 0 05/22/2018 at Unknown time  . venlafaxine (EFFEXOR) 37.5 MG tablet Take 1 tablet (37.5 mg total) by mouth daily. (Patient not taking: Reported on 05/22/2018) 30 tablet 1 Not Taking at Unknown time    Patient Stressors: Financial difficulties Substance abuse Traumatic event  Patient Strengths: Capable of independent living Physical Health  Treatment Modalities: Medication Management, Group therapy, Case management,  1 to 1 session with clinician, Psychoeducation, Recreational therapy.   Physician Treatment Plan for Primary Diagnosis:  MDD, recurrent, severe, without psychosis  Medication Management: Evaluate patient's response, side effects, and tolerance of medication regimen.  Therapeutic Interventions: 1 to 1 sessions, Unit Group sessions and Medication administration.  Evaluation of Outcomes: Not Met  Physician Treatment Plan for Secondary Diagnosis: Active Problems:   MDD (major depressive disorder), recurrent severe, without psychosis ([place])   Medication Management: Evaluate patient's response, side effects, and tolerance of medication regimen.  Therapeutic Interventions: 1 to 1 sessions, Unit Group sessions and Medication administration.  Evaluation of Outcomes: Not Met   RN Treatment Plan for Primary Diagnosis:  MDD, recurrent, severe, without psychosis Long Term Goal(s): Knowledge of disease and therapeutic regimen to maintain health will improve  Short Term Goals: Ability to  remain free from injury will improve, Ability to disclose and discuss suicidal ideas and Ability to identify and develop effective coping behaviors will improve  Medication Management: RN will administer medications as ordered by provider, will assess and evaluate patient's response and provide education to patient for prescribed medication. RN will report any  adverse and/or side effects to prescribing provider.  Therapeutic Interventions: 1 on 1 counseling sessions, Psychoeducation, Medication administration, Evaluate responses to treatment, Monitor vital signs and CBGs as ordered, Perform/monitor CIWA, COWS, AIMS and Fall Risk screenings as ordered, Perform wound care treatments as ordered.  Evaluation of Outcomes: Not Met   LCSW Treatment Plan for Primary Diagnosis:  MDD, recurrent, severe, without psychosis Long Term Goal(s): Safe transition to appropriate next level of care at discharge, Engage patient in therapeutic group addressing interpersonal concerns.  Short Term Goals: Engage patient in aftercare planning with referrals and resources, Facilitate patient progression through stages of change regarding substance use diagnoses and concerns and Identify triggers associated with mental health/substance abuse issues  Therapeutic Interventions: Assess for all discharge needs, 1 to 1 time with Social worker, Explore available resources and support systems, Assess for adequacy in community support network, Educate family and significant other(s) on suicide prevention, Complete Psychosocial Assessment, Interpersonal group therapy.  Evaluation of Outcomes: Not Met   Progress in Treatment: Attending groups: No. New to unit. Continuing to assess.  Participating in groups: No. Taking medication as prescribed: Yes. Toleration medication: Yes. Family/Significant other contact made: No, will contact:  family member if pt consents to collateral contact.  Patient understands diagnosis: Yes. Discussing patient identified problems/goals with staff: Yes. Medical problems stabilized or resolved: Yes. Denies suicidal/homicidal ideation: Yes. Issues/concerns per patient self-inventory: No. Other: n/a   New problem(s) identified: No, Describe:  n/a  New Short Term/Long Term Goal(s): detox, medication management for mood stabilization; elimination of SI  thoughts; development of comprehensive mental wellness/sobriety plan.   Patient Goals:  "I don't have a goal."   Discharge Plan or Barriers: CSW assessing for appropriate referrals. [place] pamphlet, Mobile Crisis information, and AA/NA information provided to patient for additional community support and resources.   Reason for Continuation of Hospitalization: Anxiety Depression Medication stabilization Suicidal ideation Withdrawal symptoms  Estimated Length of Stay: Monday, 05/26/18  Attendees: Patient: 05/23/2018 9:04 AM  Physician: Dr. Mallie Darting MD; Dr. Nancy Fetter MD 05/23/2018 9:04 AM  Nursing: Doroteo Bradford RN; Legrand Como RN 05/23/2018 9:04 AM  RN Care Manager:x 05/23/2018 9:04 AM  Social Worker: Janice Norrie LCSW 05/23/2018 9:04 AM  Recreational Therapist:  05/23/2018 9:04 AM  Other: Lindell Spar NP; Saul Fordyce NP 05/23/2018 9:04 AM  Other:  05/23/2018 9:04 AM  Other: 05/23/2018 9:04 AM    Scribe for Treatment Team: Avelina Laine, LCSW 05/23/2018 9:04 AM

## 2018-05-23 NOTE — [facility] Group Notes (Signed)
LCSW Group Therapy Note  05/23/2018 1:15pm  Type of Therapy and Topic:  Group Therapy:  Feelings around Relapse and Recovery  Participation Level:  Active   Description of Group:    Patients in this group will discuss emotions they experience before and after a relapse. They will process how experiencing these feelings, or avoidance of experiencing them, relates to having a relapse. Facilitator will guide patients to explore emotions they have related to recovery. Patients will be encouraged to process which emotions are more powerful. They will be guided to discuss the emotional reaction significant others in their lives may have to their relapse or recovery. Patients will be assisted in exploring ways to respond to the emotions of others without this contributing to a relapse.  Therapeutic Goals: 1. Patient will identify two or more emotions that lead to a relapse for them 2. Patient will identify two emotions that result when they relapse 3. Patient will identify two emotions related to recovery 4. Patient will demonstrate ability to communicate their needs through discussion and/or role plays   Summary of Patient Progress:  Barbara Tyler was attentive and engaged during today's processing group. She shared that she plans to follow-up with her outpatient providers at discharge and is working with her husband and family to figure out how things can be less stressful and healthier for her post discharge. Barbara Tyler describes high anxiety and mild depression with racing and irrational thoughts that make it difficult to sleep at night. She continues to show progress in the group setting with improving insight.    Therapeutic Modalities:   Cognitive Behavioral Therapy Solution-Focused Therapy Assertiveness Training Relapse Prevention Therapy   Rona RavensHeather S Tarea Skillman, LCSW 05/23/2018 10:41 AM

## 2018-05-23 NOTE — Progress Notes (Signed)
Pt reports she was upset at having to come to the hospital, but she understands now why she had to come.  She has been in the dayroom most of the evening.  She denies SI/HI/AVH at this time.  She was encouraged to make her needs known to staff.  The cuts to pt's forearm look clean and without s/s of infection.  Sutures are intact and no drainage noted.  Pt preferred to not have any bandage on her arm.  She took a shower and was encouraged to dry the area completely.  Support and encouragement offered.  Discharge plans are in process.  Safety maintained with q15 minute checks.

## 2018-05-23 NOTE — Progress Notes (Signed)
Patient did attend the evening speaker AA meeting.  

## 2018-05-23 NOTE — Progress Notes (Signed)
D: Patient denies SI, HI or AVH this morning. Patient presents as flat and depressed but pleasant and appropriate.  Pt. States that she slept well and reports a good appetite.  Pt. States that she is having urinary urgency, her urine was sent for analysis and she will be starting Bactrim today.  Pt. Requested that her sutures to the left AC be trimmed which I did per Dr. Jola Babinskilary and dressed per order.  Pt. Denies any other needs at this time.  A: Patient given emotional support from RN. Patient encouraged to come to staff with concerns and/or questions. Patient's medication routine continued. Patient's orders and plan of care reviewed.   R: Patient remains appropriate and cooperative. Will continue to monitor patient q15 minutes for safety.
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
## 2018-05-24 DIAGNOSIS — G4700 Insomnia, unspecified: Secondary | ICD-10-CM

## 2018-05-24 DIAGNOSIS — F10129 Alcohol abuse with intoxication, unspecified: Secondary | ICD-10-CM

## 2018-05-24 DIAGNOSIS — F332 Major depressive disorder, recurrent severe without psychotic features: Principal | ICD-10-CM

## 2018-05-24 DIAGNOSIS — Z6379 Other stressful life events affecting family and household: Secondary | ICD-10-CM

## 2018-05-24 DIAGNOSIS — F419 Anxiety disorder, unspecified: Secondary | ICD-10-CM

## 2018-05-24 DIAGNOSIS — R45851 Suicidal ideations: Secondary | ICD-10-CM

## 2018-05-24 DIAGNOSIS — E039 Hypothyroidism, unspecified: Secondary | ICD-10-CM

## 2018-05-24 DIAGNOSIS — Z560 Unemployment, unspecified: Secondary | ICD-10-CM

## 2018-05-24 NOTE — Progress Notes (Signed)
Patient stated in group that she had a bad day overall. She explained that she kept falling asleep every time she returned to her bedroom and consequently, missed some groups and one of the meals.

## 2018-05-24 NOTE — [facility] Group Notes (Signed)
LCSW Group Therapy Note  05/24/2018     10:00-11:00AM  Type of Therapy and Topic:  Group Therapy:  Decisional Balance/Substance Use  Participation Level:  Active        . Description of Group:  The main focus of today's process group was learning how to use a decisional balance exercise to make a decision about whether to change an unhealthy coping skill, as well as how to use the information gathered in the actual process of planning that change.  Patients listed some of their most frequently utilized unhealthy coping techniques and CSW pointed out the similarities.  Motivational Interviewing and the whiteboard were utilized to help patients explore in-depth the perceived benefits and costs of a specific, shared unhealthy coping technique (drinking & drugging) as well as the benefits and costs of replacing that with other, healthy coping skills.  A handout was distributed for patients to be able to do this exercise for themselves.     Therapeutic Goals 1. Patient will be able to utilize the decision balance exercise on their own 2. Patient will list coping skills they use to fulfill their needs 3. Patient will identify the differences between healthy and  unhealthy coping skills 4. Patient will verbalize the costs and benefits of drinking/drugging versus making the choice to change 5. Patient will learn how to use the exercise to identify the most important supports to put in place so that they can succeed in a change to which they commit  Summary of Patient Progress: During group, patient expressed anger on behalf of another patient who was angry about not being allowed to smoke cigarettes not only in this facility but in others.  CSW started group by attempting to focus on the fact that today is International Overdose Awareness Day, but another patient along with this one kept sidetracking group to discuss the things that are making them angry such as the health care system. The patient asked  the question "who has the right to tell someone they have a problem with alcohol?"  This led CSW to demonstrate the Decisional Balance Exercise to demonstrate how one determines for themselves whether an issue is a problem for them.  She was reluctant to state whether this would be a helpful exercise for her.   Therapeutic Modalities Cognitive Behavioral Therapy Motivational Interviewing   Lynnell ChadMareida J Grossman-Orr, LCSW

## 2018-05-24 NOTE — [facility] Group Notes (Signed)
[facility] Group Notes:  Nursing Psychoeducational Group  Date:  05/24/2018  Time:  1:00 PM  Group: Life Skills  Facilitator: Patty D. RN  Type of Therapy:  Psychoeducational Skills  Participation Level:  Did Not Attend  Comment: Patient was invited but declined to attend group.  Barbara Tyler A [name] 05/24/2018, 2:00 PM 

## 2018-05-24 NOTE — Progress Notes (Signed)
D: Patient is visible in the milieu.  Received another urine sample from patient due to possible contamination from earlier sample.  Patient's sutures on left arm assessed and no irritation or redness noted.  She denies any self harm thoughts.  Her goal today is to "realize all of the toxic people and situations in my life."  She plans to journal today.  Her sleep and appetite are good; her energy level is normal and her concentration is good.  A: Continue to monitor medication management and MD orders.  Safety checks completed every 15 minutes per protocol.  Offer support and encouragement as needed.  R: Patient is receptive to staff; her behavior is appropriate.

## 2018-05-24 NOTE — Plan of Care (Signed)
  Problem: Activity: Goal: Interest or engagement in activities will improve Outcome: Progressing-Attending groups and interacting well with staff and peers

## 2018-05-24 NOTE — Plan of Care (Signed)
  Problem: Medication: Goal: Compliance with prescribed medication regimen will improve Outcome: Completed/Met-Barbara Tyler takes her medications as prescribed and will ask for prn's.

## 2018-05-24 NOTE — Progress Notes (Signed)
D:  Barbara Tyler was up and visible on the unit.  She was pleasant and cooperative.  She denied SI/HI or A/V hallucinations.  She reported having a pretty good evening and was noted interacting well with peers, smiling and joking.  She denied any pain or discomfort and appeared to be in no physical distress.  Self inflicted wounds on her left forearm are clean and dry, no drainage or redness noted.   Stitches intact.  She reported that "It feels fine."  She took prn trazodone for sleep.   A:  1:1 with RN for support and encouragement.  Medications as ordered.  Q 15 minute checks maintained for safety.  Encouraged participation in group and unit activities.   R:  Barbara Tyler remains safe on the unit.  We will continue to monitor the progress towards her goals.
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
## 2018-05-24 NOTE — Progress Notes (Signed)
D:  Barbara Tyler was up and visible on the unit.  She denied SI/HI or A/V hallucinations.  She was pleasant and cooperative.  She attended evening AA group.  She reported urinary symptoms, spasms, and requested prn pyridium.  She also was worried about her urine sample not being a "clean specimen" and was requesting to have another one in the morning.  [facility] with [facility] and new order for UA in the morning.  Provided her with specimen cup and antiseptic wipe for clean catch.  She will bring to RN in the morning.  She took hs medication without difficulty.  She is currently resting with her eyes closed and appears to be asleep. A:  1:1 with RN for support and encouragement.  Medications as ordered.  Q 15 minute checks maintained for safety.  Encouraged participation in group and unit activities.   R:  Barbara Tyler remains safe on the unit.  We will continue to monitor the progress towards her goals.

## 2018-05-25 LAB — URINALYSIS, ROUTINE W REFLEX MICROSCOPIC
BILIRUBIN URINE: NEGATIVE
Bacteria, UA: NONE SEEN
Glucose, UA: NEGATIVE mg/dL
KETONES UR: NEGATIVE mg/dL
Leukocytes, UA: NEGATIVE
Nitrite: POSITIVE — AB
Protein, ur: NEGATIVE mg/dL
Specific Gravity, Urine: 1.01 (ref 1.005–1.030)
pH: 6 (ref 5.0–8.0)

## 2018-05-25 MED ORDER — IBUPROFEN 800 MG PO TABS
800.0000 mg | ORAL_TABLET | Freq: Four times a day (QID) | ORAL | Status: DC | PRN
  Administered 2018-05-25 (×2): 800 mg via ORAL
  Filled 2018-05-25 (×2): qty 1

## 2018-05-25 NOTE — Progress Notes (Signed)
Adult Psychoeducational Group Note  Date:  05/25/2018 Time: 1300  Group Topic/Focus:  Coping with Anger  Participation Level:  Active  Participation Quality:  Appropriate  Affect:  Appropriate  Cognitive:  Appropriate  Insight: Appropriate  Engagement in Group:  Engaged  Modes of Intervention:  Discussion and Education  Additional Comments:     

## 2018-05-25 NOTE — Progress Notes (Signed)
Pt  Presents with a flat affect and depressed mood. Pt rated on her self inventory sheet: depression 7/10, anxiety 7/10 and hopelessness 7/10. Pt endorses passive SI with no plan or intent. Pt verbally contracts for safety. Pt reported fair sleep last night. Pt stated goal for today, "trying to be a little more positive". Pt compliant with taking meds and denies any side effects.  Medications administered as ordered per MD. Lorenda Peck support provided. Pt encouraged to attend groups. 15 minute checks performed for safety. Pt encouraged to complete suicide safety plan.   Pt compliant with tx plan.

## 2018-05-25 NOTE — Plan of Care (Signed)
  Problem: Coping: Goal: Ability to verbalize frustrations and anger appropriately will improve Outcome: Progressing   Problem: Safety: Goal: Periods of time without injury will increase Outcome: Progressing   

## 2018-05-25 NOTE — Plan of Care (Signed)
  Problem: Health Behavior/Discharge Planning: Goal: Compliance with therapeutic regimen will improve Outcome: Completed/Met-Barbara Tyler attends groups and interacts well with staff and peers.

## 2018-05-25 NOTE — [facility] Group Notes (Signed)
Pt was invited but did not attend orientation/goals group. 
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
## 2018-05-25 NOTE — [facility] Group Notes (Signed)
[facility] LCSW Group Therapy Note  05/25/2018  10:00-11:00AM  Type of Therapy and Topic:  Group Therapy:  Being Your Own Support  Participation Level:  Did Not Attend   Description of Group:  Patients in this group were introduced to the concept that self-support is an essential part of recovery.  A song entitled "My Own Hero" was played and a group discussion ensued in which patients stated they could relate to the song and it inspired them to realize they have be willing to help themselves in order to succeed, because other people cannot achieve sobriety or stability for them.  We discussed adding a variety of healthy supports to address the various needs in their lives.  A song was played called "I Know Where I've Been" toward the end of group and used to conduct an inspirational wrap-up to group of remembering how far they have already come in their journey.  Therapeutic Goals: 1)  demonstrate the importance of being a part of one's own support system 2)  discuss reasons people in one's life may eventually be unable to be continually supportive  3)  identify the patient's current support system and   4)  elicit commitments to add healthy supports and to become more conscious of being self-supportive   Summary of Patient Progress:  N/A   Therapeutic Modalities:   Motivational Interviewing Activity  Samyukta Cura J Grossman-Orr       

## 2018-05-25 NOTE — Progress Notes (Signed)
Patient attended the evening A.A. meeting and was appropriate.  

## 2018-05-25 NOTE — Progress Notes (Signed)
D:  Barbara Tyler reported her day was going "ok but then I got to see my kids and that made it better."  She denied SI/HI or A/V hallucinations.  She is up and visible.  Interacts well with staff and peers.  She denied any pain today and no urinary symptoms voiced.  She attended evening wrap up group.  She took her hs medications without difficulty and voiced no issues at this time.  She appears to be in no physical distress.  She has been noted laughing and joking on the unit.  She did report that she is unsure of what her discharge plans are at this time and encouraged her to talk with SW and MD tomorrow. A:  1:1 with RN for support and encouragement.  Medications as ordered.  Q 15 minute checks maintained for safety.  Encouraged participation in group and unit activities.   R:  Talmadge remains safe on the unit.  We will continue to monitor the progress towards her goals.

## 2018-05-26 ENCOUNTER — Encounter (HOSPITAL_COMMUNITY): Payer: Self-pay | Admitting: Behavioral Health

## 2018-05-26 MED ORDER — LEVOTHYROXINE SODIUM 75 MCG PO TABS: 75.0000 ug | ORAL_TABLET | Freq: Every day | ORAL | 0 refills | Status: AC

## 2018-05-26 MED ORDER — HYDROXYZINE HCL 25 MG PO TABS: 25.0000 mg | ORAL_TABLET | Freq: Three times a day (TID) | ORAL | 0 refills | Status: DC | PRN

## 2018-05-26 MED ORDER — BUSPIRONE HCL 10 MG PO TABS: 10.0000 mg | ORAL_TABLET | Freq: Two times a day (BID) | ORAL | 0 refills | Status: DC

## 2018-05-26 MED ORDER — SULFAMETHOXAZOLE-TRIMETHOPRIM 400-80 MG PO TABS: 1.0000 | ORAL_TABLET | Freq: Two times a day (BID) | ORAL | 0 refills | Status: AC

## 2018-05-26 MED ORDER — TRAZODONE HCL 50 MG PO TABS: 50.0000 mg | ORAL_TABLET | Freq: Every evening | ORAL | 0 refills | Status: DC | PRN

## 2018-05-26 MED ORDER — SULFAMETHOXAZOLE-TRIMETHOPRIM 400-80 MG PO TABS
1.0000 | ORAL_TABLET | Freq: Two times a day (BID) | ORAL | Status: DC
  Filled 2018-05-26 (×4): qty 1

## 2018-05-26 NOTE — Progress Notes (Signed)
Pt requested to know about her UA.  Staffed with Donell Sievert PA and order obtained for urine culture.  Called lab and they are able to use previous urine sample.

## 2018-05-26 NOTE — Progress Notes (Signed)
  [facility] Adult Case Management Discharge Plan :  Will you be returning to the same living situation after discharge:  Yes,  home At discharge, do you have transportation home?: Yes,  husband or family member Do you have the ability to pay for your medications: Yes,  [facility]  Release of information consent forms completed and submitted to medical records by CSW.   Patient to Follow up at: Follow-up Information    Please resume services with your current provider for medication management and therapy (patient did not remember name/agency of providers,therefore follow-up appointments could not be made). Follow up.        [facility] Follow up.   Specialty:  Behavioral Health Why:  You may also walk in to [facility] to be seen for hospital follow-up/outpatient mental health services. Walk in hours: Mon-Fri 8am-9am. Thank you.  Contact information: [address] [place] [address] [phone]           Next level of care provider has access to [facility] Link:no  Safety Planning and Suicide Prevention discussed: Yes,  SPE completed with pt; pt declined to consent to collateral contact. SPI pamphlet and mobile Crisis information provided.   Have you used any form of tobacco in the last 30 days? (Cigarettes, Smokeless Tobacco, Cigars, and/or Pipes): Yes  Has patient been referred to the Quitline?: Patient refused referral  Patient has been referred for addiction treatment: Yes  Rona Ravens, LCSW 05/26/2018, 9:30 AM

## 2018-05-26 NOTE — Progress Notes (Signed)
D: Pt A & O X 4. Denies SI, HI, AVH and pain at this time. Rates her depression 8/10, hopelessness 5/10 and anxiety 10/10 "just happy and nervous about going home". Reports she slept well last night with good appetite, low energy and poor concentration. D/C home as ordered. Picked up in lobby by her family. A: D/C instructions reviewed with pt including prescriptions and follow up appointment; compliance encouraged. All belongings from locker # 19 given to pt at time of departure. Scheduled medications given with verbal education and effects monitored. Safety checks maintained without incident till time of d/c.  R: Pt receptive to care. Compliant with medications when offered. Denies adverse drug reactions when assessed. Verbalized understanding related to d/c instructions. Signed belonging sheet in agreement with items received from locker. Ambulatory with a steady gait. Appears to be in no physical distress at time of departure.

## 2018-05-26 NOTE — [facility] Suicide Risk Assessment (Signed)
[facility] Discharge Suicide Risk Assessment   Principal Problem: MDD (major depressive disorder), recurrent severe, without psychosis (HCC) Discharge Diagnoses:  Patient Active Problem List   Diagnosis Date Noted  . 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 MDD (major depressive disorder), recurrent severe, without psychosis (HCC) [F33.2] 05/22/2018  . Panic disorder [F41.0] 05/11/2016  . 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 Relationship problems [Z63.9] 05/11/2016  . Short cervix, antepartum [O26.879] 04/22/2014  . Pain, dental [K08.89] 01/06/2014  . Subclinical hypothyroidism [E03.9] 04/01/2013  . Heterozygous factor V Leiden mutation (HCC) [D68.51] 04/01/2013  . 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 Depression [F32.9] 03/23/2013    Total Time spent with patient: 15 minutes  Musculoskeletal: Strength & Muscle Tone: within normal limits Gait & Station: normal Patient leans: N/A  Psychiatric Specialty Exam: Review of Systems  All other systems reviewed and are negative.   Blood pressure 95/74, pulse 90, temperature 98.3 F (36.8 C), temperature source Oral, resp. rate 16, height 5' 3.5" (1.613 m), weight 60.8 kg, SpO2 100 %, currently breastfeeding.Body mass index is 23.36 kg/m.  General Appearance: Casual  Eye Contact::  Good  Speech:  Normal Rate409  Volume:  Normal  Mood:  Euthymic  Affect:  Congruent  Thought Process:  Coherent and Descriptions of Associations: Intact  Orientation:  Full (Time, Place, and Person)  Thought Content:  Logical  Suicidal Thoughts:  No  Homicidal Thoughts:  No  Memory:  Immediate;   Fair Recent;   Fair Remote;   Fair  Judgement:  Intact  Insight:  Good  Psychomotor Activity:  Normal  Concentration:  Fair  Recall:  [employer] of Knowledge:Fair  Language: Good  Akathisia:  Negative  Handed:  Right  AIMS (if indicated):      Assets:  Communication Skills Desire for Improvement Housing Physical Health Resilience Talents/Skills  Sleep:  Number of Hours: 6  Cognition: WNL  ADL's:  Intact   Mental Status Per Nursing Assessment::   On Admission:  NA  Demographic Factors:  Divorced or widowed, Caucasian, Low socioeconomic status and Unemployed  Loss Factors: NA  Historical Factors: Impulsivity  Risk Reduction Factors:   Responsible for children under 16 years of age, Sense of responsibility to family, Living with another person, especially a relative, Positive social support and Positive coping skills or problem solving skills  Continued Clinical Symptoms:  Depression:   Comorbid alcohol abuse/dependence Impulsivity Alcohol/Substance Abuse/Dependencies  Cognitive Features That Contribute To Risk:  None    Suicide Risk:  Minimal: No identifiable suicidal ideation.  Patients presenting with no risk factors but with morbid ruminations; may be classified as minimal risk based on the severity of the depressive symptoms  Follow-up Information    Please resume services with your current provider for medication management and therapy (patient did not remember name/agency of providers,therefore follow-up appointments could not be made). Follow up.        [facility] Follow up.   Specialty:  Behavioral Health Why:  You may also walk in to [facility] to be seen for hospital follow-up/outpatient mental health services. Walk in hours: Mon-Fri 8am-9am. Thank you.  Contact information: [address] [place] [address] [phone]           Plan Of Care/Follow-up recommendations:  Activity:  ad lib  Antonieta Pert, MD 05/26/2018, 9:58 AM

## 2018-05-26 NOTE — [facility] Suicide Risk Assessment (Signed)
[facility] INPATIENT:  Family/Significant Other Suicide Prevention Education  Suicide Prevention Education:  Patient Refusal for Family/Significant Other Suicide Prevention Education: The patient Barbara Tyler has refused to provide written consent for family/significant other to be provided Family/Significant Other Suicide Prevention Education during admission and/or prior to discharge.  Physician notified.  SPE completed with pt, as pt refused to consent to family contact. SPI pamphlet provided to pt and pt was encouraged to share information with support network, ask questions, and talk about any concerns relating to SPE. Pt denies access to guns/firearms and verbalized understanding of information provided. Mobile Crisis information also provided to pt.   Rona Ravens LCSW 05/26/2018, 9:28 AM
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
## 2018-05-26 NOTE — Progress Notes (Signed)
Recreation Therapy Notes  Date: 05/26/18 Time: 0930 Location: [address] Dayroom  Group Topic: Stress Management  Goal Area(s) Addresses:  Patient will verbalize importance of using healthy stress management.  Patient will identify positive emotions associated with healthy stress management.   Behavioral Response: Engaged  Intervention: Stress Management  Activity :  Meditation.  LRT introduced the stress management technique of meditation.  Patients were to listen and follow along as the meditation played in order to relax.  Education:  Stress Management, Discharge Planning.   Education Outcome: Acknowledges edcuation/In group clarification offered/Needs additional education  Clinical Observations/Feedback: Pt attended group.     Sheikh Leverich, LRT/CTRS         Lauraann Missey A 05/26/2018 12:12 PM 

## 2018-05-27 LAB — URINE CULTURE
CULTURE: NO GROWTH
Special Requests: NORMAL

## 2022-05-20 ENCOUNTER — Other Ambulatory Visit: Payer: Self-pay

## 2022-05-20 ENCOUNTER — Encounter (HOSPITAL_COMMUNITY): Payer: Self-pay

## 2022-05-20 ENCOUNTER — Emergency Department (HOSPITAL_COMMUNITY)
Admission: EM | Admit: 2022-05-20 | Discharge: 2022-05-20 | Disposition: A | Discharge status: Home / Self Care | Attending: Emergency Medicine | Admitting: Emergency Medicine

## 2022-05-20 DIAGNOSIS — F439 Reaction to severe stress, unspecified: Secondary | ICD-10-CM | POA: Diagnosis not present

## 2022-05-20 DIAGNOSIS — F410 Panic disorder [episodic paroxysmal anxiety] without agoraphobia: Secondary | ICD-10-CM

## 2022-05-20 DIAGNOSIS — Z91040 Latex allergy status: Secondary | ICD-10-CM | POA: Insufficient documentation

## 2022-05-20 DIAGNOSIS — E039 Hypothyroidism, unspecified: Secondary | ICD-10-CM | POA: Insufficient documentation

## 2022-05-20 DIAGNOSIS — F419 Anxiety disorder, unspecified: Secondary | ICD-10-CM | POA: Diagnosis present

## 2022-05-20 MED ORDER — HYDROXYZINE HCL 25 MG PO TABS: 25.0000 mg | ORAL_TABLET | Freq: Four times a day (QID) | ORAL | 0 refills | Status: DC | PRN

## 2022-05-20 MED ORDER — LORAZEPAM 1 MG PO TABS
2.0000 mg | ORAL_TABLET | Freq: Once | ORAL | Status: AC
  Administered 2022-05-20: 2 mg via ORAL
  Filled 2022-05-20: qty 2

## 2022-05-20 NOTE — ED Provider Notes (Signed)
[facility]-EMERGENCY DEPT Provider Note   CSN: [id number] Arrival date & time: 05/20/22  1411     History  Chief Complaint  Patient presents with   Anxiety    Barbara Tyler is a 46 y.o. female.  Patient is a 46 year old female with a history of lupus, factor V and hypothyroidism who is presenting today with severe anxiety.  She reports for the last 4 to 5 days she has been having crippling panic attacks which sometimes will happen 2 or 3 times per day.  She reports that she has had a history of anxiety for a long time and she takes Lexapro for this but she has not had panic attacks in almost 8 years.  She does have a history of lupus and has had a flare for the last month and a half which she reports is very long for her typical lupus flare.  She also finished a 10-day course of prednisone about a week ago.  She states that she has just become overwhelmed because they are all these medical test she supposed to go for an specialist she is supposed to see and it is more than she can handle.  She also has children at home and she just reports everything is just become very stressful.  She reports when she is in a panic attack and gets severe she thinks she would hurt herself because she cannot live like this.  She has been drinking 6-8 drinks the last few days to just kind of take the edge off.  She was planning on calling her doctor tomorrow for further evaluation and possible medication adjustments but had a panic attack today that lasted an hour and she needed help immediately.  She does take CBD Gummies but denies other drugs.  The history is provided by the patient and the spouse.  Anxiety       Home Medications Prior to Admission medications   Medication Sig Start Date End Date Taking? Authorizing Provider  hydrOXYzine (ATARAX) 25 MG tablet Take 1 tablet (25 mg total) by mouth every 6 (six) hours as needed for anxiety. 05/20/22  Yes Delberta Folts, Alphonzo Lemmings, MD   busPIRone (BUSPAR) 10 MG tablet Take 1 tablet (10 mg total) by mouth 2 (two) times daily. 05/26/18   Denzil Magnuson, NP  levothyroxine (SYNTHROID, LEVOTHROID) 75 MCG tablet Take 1 tablet (75 mcg total) by mouth daily before breakfast. 05/26/18   Denzil Magnuson, NP  traZODone (DESYREL) 50 MG tablet Take 1 tablet (50 mg total) by mouth at bedtime as needed for sleep. 05/26/18   Denzil Magnuson, NP      Allergies    Latex    Review of Systems   Review of Systems  Physical Exam Updated Vital Signs BP 121/84 (BP Location: Left Arm)   Pulse 95   Temp 99.1 F (37.3 C) (Oral)   Resp 15   Ht 5\' 4"  (1.626 m)   Wt 61 kg   SpO2 96%   BMI 23.08 kg/m  Physical Exam Vitals and nursing note reviewed.  Constitutional:      General: She is not in acute distress.    Appearance: She is well-developed.  HENT:     Head: Normocephalic and atraumatic.  Eyes:     Pupils: Pupils are equal, round, and reactive to light.  Cardiovascular:     Rate and Rhythm: Normal rate and regular rhythm.     Heart sounds: Normal heart sounds. No murmur heard.    No  friction rub.  Pulmonary:     Effort: Pulmonary effort is normal.     Breath sounds: Normal breath sounds. No wheezing or rales.  Abdominal:     General: Bowel sounds are normal. There is no distension.     Palpations: Abdomen is soft.     Tenderness: There is no abdominal tenderness. There is no guarding or rebound.  Musculoskeletal:        General: No tenderness. Normal range of motion.     Comments: No edema  Skin:    General: Skin is warm and dry.     Findings: No rash.  Neurological:     Mental Status: She is alert and oriented to person, place, and time.     Cranial Nerves: No cranial nerve deficit.  Psychiatric:        Mood and Affect: Mood is anxious.        Behavior: Behavior is agitated.     Comments: Patient reports in the past she has attempted to hurt herself by cutting herself and does think of about not wanting to live when she  has a severe panic attack but not having constant suicidal thoughts     ED Results / Procedures / Treatments   Labs (all labs ordered are listed, but only abnormal results are displayed) Labs Reviewed - No data to display  EKG None  Radiology No results found.  Procedures Procedures    Medications Ordered in ED Medications  LORazepam (ATIVAN) tablet 2 mg (2 mg Oral Given 05/20/22 1641)    ED Course/ Medical Decision Making/ A&P                           Medical Decision Making Risk Prescription drug management.   Patient presenting today with recurrent panic attacks and anxiety.  She does take Lexapro for this but it is not helping.  Here she does appear very anxious.  Does not appear to have an acute medical issue at this time but does have underlying medical problems which is most likely adding into the cause of her anxiety and panic attacks.  We will give patient oral Ativan and reevaluate.  At this time she does not meet IVC criteria. 6:11 PM After Ativan patient is feeling much better.  Speaking with patient's husband he is not concerned that she would attempt to hurt herself outside of having a panic attack.  They do not desire to stay for evaluation here but would like to follow-up with her regular doctor tomorrow.  She was given a prescription for Vistaril.  Also she was given the information for behavioral health urgent care.        Final Clinical Impression(s) / ED Diagnoses Final diagnoses:  Panic attack    Rx / DC Orders ED Discharge Orders          Ordered    hydrOXYzine (ATARAX) 25 MG tablet  Every 6 hours PRN        05/20/22 1810              Gwyneth Sprout, MD 05/20/22 1811

## 2022-05-20 NOTE — ED Triage Notes (Signed)
Patient BIBA from home. Husband states anxiety has become worse over the past couple days. Patient stated to EMS that she does not want to talk about. Patient did state once with EMS she felt safer. No SI/HI.

## 2022-05-23 ENCOUNTER — Ambulatory Visit (HOSPITAL_COMMUNITY)
Admission: EM | Admit: 2022-05-23 | Discharge: 2022-05-23 | Disposition: A | Discharge status: Home / Self Care | Attending: Registered Nurse | Admitting: Registered Nurse

## 2022-05-23 ENCOUNTER — Encounter (HOSPITAL_COMMUNITY): Payer: Self-pay | Admitting: Registered Nurse

## 2022-05-23 DIAGNOSIS — F411 Generalized anxiety disorder: Secondary | ICD-10-CM

## 2022-05-23 DIAGNOSIS — F410 Panic disorder [episodic paroxysmal anxiety] without agoraphobia: Secondary | ICD-10-CM

## 2022-05-23 DIAGNOSIS — F332 Major depressive disorder, recurrent severe without psychotic features: Secondary | ICD-10-CM | POA: Diagnosis present

## 2022-05-23 NOTE — Discharge Instructions (Addendum)
You can try Seroquel 50 mg tablet take  tablet Bid and continue your Hydroxyzine 25 mg Tid.  The Hydroxyzine is also good for sleep. You have been provided a list of outpatient psychiatric provider.  Call insurance to find provider in your network.            It is imperative that you follow through with treatment recommendations within 5-7 days from the day of discharge to mitigate further risk to your safety and overall mental well-being.  A list of outpatient therapy and psychiatric providers for medication management has been provided below to get you started in finding the right provider for you.            [facility] [address] [place], [place], [address] [phone] phone (Medicare, Private insurance except Tricare, [place], and [facility])  [facility] [address] [place], [place], [address] [address] phone ([address] AmeriHealth Caritas - [place], [address], [place], [place], Friday Health Plans, [address], BCBS Healthy [place], [place], [employer], [place], [place], [place], Optum, Tricare, [facility], [facility] Community Plan, [facility])  [facility] [address] [place], [place], [address] [phone] phone (Call to confirm insurance coverage) Consultation & Support Services     o Drop-In Hours: 1:00 PM to 5:00 PM     o Days: Monday - Thursday  Crisis Services (24/7)   [facility] [address] [place], [place], [address] [phone] phone [email or web address]  (to complete the intake form and upload ID and insurance cards)  [facility] [address] [place], [place], [address] [phone] phone [phone] fax (Medicaid, Medicare, Self-pay, call about other insurance coverage)  [facility] (age 15+) [address] [place], [place], [address] [phone] hone [phone] fax ([place], [address], [place], [place], [place], [address], [place], [place], [place], [place], certain Medicare providers, [facility])  [facility] [address] [place], [place], [address] [phone] phone [phone] fax (Call [phone] to see what insurance is accepted) Archer Asa, MD specializes in geropsych)  [facility]  (medication management only) [address] [place], [place], [address] [phone] phone [phone] fax ([address], Medicaid, [place], [place], [place], [place], [place], [place], [place])  [facility] (medication management only) [address] [place], [place], [address] [phone] phone [phone] fax ([address], Medicare, [place], [place], Tricare [place])  [facility] [address] [place], [place], [address] [phone] phone (Call about insurance coverage)  [facility] [address] [place], [place], [address] [phone] phone [phone] fax (Call about insurance coverage)  [facility] [address] [place], [place], [address] [phone] phone [phone] fax (Call about insurance coverage)  [facility] [phone] N[address], [place], [address] [phone] phone (Medicaid, Tricare, [place], [place], [place])  [facility] [address] [place], [place], [address] [phone] phone [phone] fax (Medicaid, Medicare, Tricare, call about other insurance coverage)  [facility] [address], [place], [address] [phone] phone ([address], [place], [place], [place], [place] types - Alliance, AmeriHealth, Partners, [place], [place] Health Choice, Healthy [place], [place], [place], and Complete)

## 2022-05-23 NOTE — ED Provider Notes (Signed)
Behavioral Health Urgent Care Medical Screening Exam  Patient Name: Barbara Tyler MRN: [id number] Date of Evaluation: 05/23/22 Chief Complaint:   Diagnosis:  Final diagnoses:  GAD (generalized anxiety disorder)  Panic disorder    History of Present illness: Barbara Tyler is a 46 y.o. female patient presented to [facility] as a walk in accompanied by her husband Rodman Key with complaints of worsening anxiety and panic attacks  Lafonda Mosses, 46 y.o., female patient seen face to face by this provider, consulted with Dr. Hampton Abbot; and chart reviewed on 05/23/22.  On evaluation Barbara Tyler reports "I have systemic lupus and when ever I get a flare up I would get anxious.  Recently I started having a panic attack without getting a flare up and I just want them to stop."  Patient states that she is prescribed the Seroquel 50 mg Q hs and Hydroxyzine 25 mg Tid.  "Since I have started this concoction I haven't had a panic attack but my anxiety is worse.  I was wondering if I could take the Seroquel during the day and the Hydroxyzine at night."  Patient informed that she could take the Seroquel during the day.  She states that the tablet is scored.  Informed she could try Seroquel 25 mg (1/2 tablet ) Bid.  Also informed that Hydroxyzine could help with sleep.  Patient states the anxiety and panic attacks make her feel like she is not a good mother when it happens in front of her children.  Patient denies suicidal/self-harm/homicidal ideation, psychosis, and paranoia.  States she is interested in outpatient psychiatric services but unsure where she could get services.  "My PCP told me it could take up to 6 weeks to get in to see somebody."   Patient husband is by patients side and states that he doesn't feel that patient is a danger to herself or others but would benefit from outpatient psychiatric services medication management and therapy.   During evaluation Barbara Tyler is sitting up in  chair with no noted distress.  She is alert/oriented x 4; calm/cooperative.  Her mood is anxious with congruent affect.  She is speaking in a clear tone at moderate volume, and normal pace; with good eye contact.  Her thought process is coherent, relevant, and there is no indication that she is currently responding to internal/external stimuli or experiencing delusional thought content; and she denies suicidal/self-harm/homicidal ideation, psychosis, and paranoia.    At this time Barbara Tyler is educated and verbalizes understanding of mental health resources and other crisis services in the community. She is instructed to call 911 and present to the nearest emergency room should she experience any suicidal/homicidal ideation, auditory/visual/hallucinations, or detrimental worsening of her mental health condition.  She was a also advised by Probation officer that she could call the toll-free phone on insurance card to assist with identifying in network counselors and agencies.    Psychiatric Specialty Exam  Presentation  General Appearance:Appropriate for Environment; Casual  Eye Contact:Good  Speech:Clear and Coherent; Normal Rate  Speech Volume:Normal  Handedness:No data recorded  Mood and Affect  Mood:Anxious  Affect:Appropriate; Congruent   Thought Process  Thought Processes:Coherent; Goal Directed  Descriptions of Associations:Intact  Orientation:Full (Time, Place and Person)  Thought Content:Logical    Hallucinations:None  Ideas of Reference:None  Suicidal Thoughts:No  Homicidal Thoughts:No   Sensorium  Memory:Immediate Good; Recent Good; Remote Good  Judgment:Intact  Insight:Present   Executive Functions  Concentration:Good  Attention Span:Good  Recall:Good  Fund of Knowledge:Good  Language:Good   Psychomotor Activity  Psychomotor Activity:Normal   Assets  Assets:Communication Skills; Desire for Improvement; Financial Resources/Insurance; Housing;  Leisure Time; Physical Health; Resilience; Social Support   Sleep  Sleep:Fair  Number of hours: No data recorded  Nutritional Assessment (For OBS and FBC admissions only) Has the patient had a weight loss or gain of 10 pounds or more in the last 3 months?: No Has the patient had a decrease in food intake/or appetite?: No Does the patient have dental problems?: No Does the patient have eating habits or behaviors that may be indicators of an eating disorder including binging or inducing vomiting?: No Has the patient recently lost weight without trying?: 0 Has the patient been eating poorly because of a decreased appetite?: 0 Malnutrition Screening Tool Score: 0    Physical Exam: Physical Exam Vitals and nursing note reviewed. Exam conducted with a chaperone present.  Constitutional:      General: She is not in acute distress.    Appearance: Normal appearance. She is not ill-appearing.  Eyes:     Pupils: Pupils are equal, round, and reactive to light.  Cardiovascular:     Rate and Rhythm: Normal rate.  Pulmonary:     Effort: Pulmonary effort is normal.  Musculoskeletal:        General: Normal range of motion.     Cervical back: Normal range of motion.  Skin:    General: Skin is warm and dry.  Neurological:     Mental Status: She is alert and oriented to person, place, and time.  Psychiatric:        Attention and Perception: Attention and perception normal. She does not perceive auditory or visual hallucinations.        Mood and Affect: Affect normal. Mood is anxious.        Speech: Speech normal.        Behavior: Behavior normal. Behavior is cooperative.        Thought Content: Thought content normal. Thought content is not paranoid or delusional. Thought content does not include homicidal or suicidal ideation.        Cognition and Memory: Cognition normal.        Judgment: Judgment normal.   Review of Systems  Constitutional: Negative.   Eyes: Negative.   Respiratory:  Negative.    Cardiovascular: Negative.   Gastrointestinal: Negative.   Genitourinary: Negative.   Musculoskeletal: Negative.   Skin: Negative.   Neurological: Negative.   Endo/Heme/Allergies: Negative.   Psychiatric/Behavioral:  Depression: Stable. Hallucinations: Denies. Substance abuse: Drinks 4 alcoholic drinks daily and occassional CBD gummies. Suicidal ideas: Denies. Nervous/anxious: Worsening anxiety.  Panic attack controlled with Seroquel but not anxiety. Insomnia: Trouble sleeping related to anxiety.    Blood pressure 103/76, pulse 98, temperature 98.2 F (36.8 C), temperature source Oral, resp. rate 18, height 5\' 4"  (1.626 m), weight 112 lb (50.8 kg), SpO2 98 %, currently breastfeeding. Body mass index is 19.22 kg/m.  Musculoskeletal: Strength & Muscle Tone: within normal limits Gait & Station: normal Patient leans: N/A   BHUC MSE Discharge Disposition for Follow up and Recommendations: Based on my evaluation the patient does not appear to have an emergency medical condition and can be discharged with resources and follow up care in outpatient services for Medication Management, Individual Therapy, and Intensive Outpatient Program    Discharge Instructions      You can try Seroquel 50 mg tablet take  tablet Bid and continue your Hydroxyzine 25 mg  Tid.  The Hydroxyzine is also good for sleep. You have been provided a list of outpatient psychiatric provider.  Call insurance to find provider in your network.            It is imperative that you follow through with treatment recommendations within 5-7 days from the day of discharge to mitigate further risk to your safety and overall mental well-being.  A list of outpatient therapy and psychiatric providers for medication management has been provided below to get you started in finding the right provider for you.            [facility] [address] [place], [place],  [address] [phone] phone (Medicare, Private insurance except [place], [place], and [facility])  [facility] [address] [place], [place], [address] [phone] phone ([address] AmeriHealth Caritas - [place], BCBS, [place], [place], Friday Health Plans, [facility], BCBS Healthy [place], [place], [employer], [place], [place], [place], Optum, Tricare, [facility], [facility] Community Plan, [facility])  [facility] [address] [place], [place], [address] [phone] phone (Call to confirm insurance coverage) Consultation & Support Services     o Drop-In Hours: 1:00 PM to 5:00 PM     o Days: Monday - Thursday  Crisis Services (24/7)   [facility] [address] [place], [place], [address] [phone] phone [email or web address]  (to complete the intake form and upload ID and insurance cards)  [facility] [id number] [place], [place], [address] [phone] phone [phone] fax (Medicaid, Medicare, Self-pay, call about other insurance coverage)  [facility] (age 38+) [address] [place], [place], [address] [phone] hone [phone] fax ([place], MedCost, [place], [place], [place], [place], [place], [place], [place], [place], certain Medicare providers, [facility], [facility])  [facility] [address] [place], [place], [address] [phone] phone [phone] fax (Call [phone] to see what insurance is accepted) Norma Fredrickson, MD specializes in geropsych)  [facility]  (medication management only) [address] [place], [place], [address] [phone] phone [phone] fax ([address], Medicaid, [place], [place], [place], [place], [place], [place], [place])  [facility] (medication management only) [address]  [place], [place], [address] [phone] phone [phone] fax ([address] Medicare, [place], [place], [place])  [facility] [place], [place], [address] [phone] phone (Call about insurance coverage)  [facility] [address] [place], [place], [address] [phone] phone [phone] fax (Call about insurance coverage)  [facility] [address] [place], [place], [address] [phone] phone [phone] fax (Call about insurance coverage)  [facility] [phone] [address] [place], [address] [phone] phone (Medicaid, [place], [place], [place], [place])  [address] [facility] [address] [place], [place], [address] [phone] phone [phone] fax (Medicaid, Medicare, Tricare, call about other insurance coverage)  [facility] [place] [place], [place], [address] [phone] phone ([address] [place], [place], [place], [place], [place] types - Alliance, Stage manager, [place], [place], [place] Choice, Healthy [place], [place], [place], and Complete)      Follow-up Information     Call  [employer], [employer].   Why: In-office and online appointments available Contact information: [place] [address] [phone]                   Earleen Newport, NP  05/23/2022, 2:44 PM

## 2022-05-23 NOTE — BH Assessment (Signed)
LCSW Progress Note   Per Shuvon Rankin, NP, this pt does not require psychiatric hospitalization at this time.  Pt is psychiatrically cleared.  Discharge instructions include several resources for outpatient therapy and medication management who accept TRICARE.  EDP Shuvon Rankin, NP, has been notified.  Hansel Starling, MSW, LCSW [facility] [phone] or [phone]

## 2022-05-23 NOTE — BH Assessment (Signed)
Patient presents to the [facility] with her husband for follow-up to an ED visit.  Patient states that she has debilitating anxiety and was suicidal a couple days ago with plan to jump out a window.  Patient states that she went to the ED and was prescribed Hydroxyzine and was given Ativan in the ED.  Patient states that she followed up with her PCP the next day and was prescribed Seroquel 50 mg po qhs.  Patient states that she started the medication, but it has made her really tired.  Patient states that she is still feeling anxoius.  Patient denies current SI, HI. Psychosis.  She states that she is not sleeping or eating.  Patient states that she has been drinking 4-6 white claws daily and using CBD gummies to self-medicate her anxiety.  Her last use of alcohol was last pm and her last use of CBD gummies was one week ago.  Patient states that she is not sleeping or eating. Patient is routine

## 2022-07-31 ENCOUNTER — Ambulatory Visit (HOSPITAL_COMMUNITY): Admitting: Psychiatry

## 2022-07-31 ENCOUNTER — Telehealth (HOSPITAL_COMMUNITY): Payer: Self-pay | Admitting: Psychiatry

## 2022-08-08 ENCOUNTER — Ambulatory Visit (HOSPITAL_COMMUNITY): Admitting: Student in an Organized Health Care Education/Training Program

## 2022-08-30 ENCOUNTER — Encounter (HOSPITAL_COMMUNITY): Payer: Self-pay

## 2022-08-30 ENCOUNTER — Ambulatory Visit (HOSPITAL_COMMUNITY): Admitting: Psychiatry

## 2023-02-05 ENCOUNTER — Other Ambulatory Visit: Payer: Self-pay | Admitting: Obstetrics and Gynecology

## 2023-07-03 ENCOUNTER — Ambulatory Visit

## 2023-07-03 ENCOUNTER — Ambulatory Visit: Admission: EM | Admit: 2023-07-03 | Discharge: 2023-07-03 | Disposition: A | Discharge status: Home / Self Care

## 2023-07-03 DIAGNOSIS — M79672 Pain in left foot: Secondary | ICD-10-CM

## 2023-07-03 DIAGNOSIS — M79675 Pain in left toe(s): Secondary | ICD-10-CM

## 2023-07-03 NOTE — ED Triage Notes (Signed)
Patient here today with c/o left great toe injury last night. Patient was walking down the stairs in the dark and her foot slipped and she fell down the last 2 steps.

## 2023-07-03 NOTE — Discharge Instructions (Signed)
I will call with x-ray results.  Postop shoe applied.  Elevate and apply ice.  I have provided you with contact information for foot doctor as well.

## 2023-07-03 NOTE — ED Provider Notes (Signed)
[facility]    CSN: [id number] Arrival date & time: 07/03/23  1023      History   Chief Complaint Chief Complaint  Patient presents with   Toe Injury    HPI Barbara Tyler is a 47 y.o. female.   Patient presents with left great toe pain after an injury that occurred last night.  Patient reports that she fell down the last few steps last night around 2 AM.  She is not sure exactly how she twisted her foot.  Denies hitting head or losing consciousness.  She has taken Advil for pain.  Denies numbness or tingling.  Pain is present in the left great toe.     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   Relationship problems 05/11/2016   Short cervix, antepartum 04/22/2014   Pain, dental 01/06/2014   Subclinical hypothyroidism 04/01/2013   Heterozygous factor V Leiden mutation (HCC) 04/01/2013   History of recurrent spontaneous abortion, not currently pregnant 03/23/2013   Depression 03/23/2013    Past Surgical History:  Procedure Laterality Date   CERVICAL CERCLAGE     CERVICAL CERCLAGE N/A 04/23/2014   Procedure: CERCLAGE CERVICAL;  Surgeon: Reva Bores, MD;  Location: [facility];  Service: Gynecology;  Laterality: N/A;   CRYOTHERAPY     DILATION AND EVACUATION N/A 02/27/2013   Procedure: DILATATION AND EVACUATION;  Surgeon: Reva Bores, MD;  Location: [facility];  Service: Gynecology;  Laterality: N/A;   NO PAST SURGERIES      OB History      Gravida  7   Para  3   Term  2   Preterm  1   AB  4   Living  3      SAB  4   IAB      Ectopic      Multiple  0   Live Births  3            Home Medications    Prior to Admission medications   Medication Sig Start Date End Date Taking? Authorizing Provider  aspirin EC 81 MG tablet Take 81 mg by mouth daily. 06/19/22  Yes [provider]  Cholecalciferol (VITAMIN D3) 50 MCG (2000 UT) capsule Take 2,000 Units by mouth daily. 06/19/22  Yes [provider]  levothyroxine (SYNTHROID, LEVOTHROID) 75 MCG tablet Take 1 tablet (75 mcg total) by mouth daily before breakfast. 05/26/18   Denzil Magnuson, NP  sertraline (ZOLOFT) 100 MG tablet Take 200 mg by mouth daily.    [provider]    Family History Family History  Problem Relation Age of Onset   Cancer Father    Cancer Maternal Grandmother        breast- great grandma    Social History Social History   Tobacco Use   Smoking status: Never   Smokeless tobacco: Never  Substance Use Topics   Alcohol use:  Yes    Alcohol/week: 4.0 standard drinks of alcohol    Types: 4 Cans of beer per week    Comment: rare   Drug use: No     Allergies   Latex   Review of Systems Review of Systems Per HPI  Physical Exam Triage Vital Signs ED Triage Vitals  Encounter Vitals Group     BP 07/03/23 1038 109/71     Systolic BP Percentile --      Diastolic BP Percentile --      Pulse Rate 07/03/23 1038 82     Resp 07/03/23 1038 16     Temp 07/03/23 1038 98.5 F (36.9 C)     Temp Source 07/03/23 1038 Oral     SpO2 07/03/23 1038 97 %     Weight 07/03/23 1038 130 lb (59 kg)     Height 07/03/23 1038 5\' 4"  (1.626 m)     Head Circumference --      Peak Flow --      Pain Score 07/03/23 1037 8     Pain Loc --      Pain Education --      Exclude from Growth Chart --    No data found.  Updated Vital Signs BP 109/71 (BP Location: Left Arm)   Pulse 82   Temp 98.5 F (36.9 C) (Oral)   Resp  16   Ht 5\' 4"  (1.626 m)   Wt 130 lb (59 kg)   LMP 07/02/2023 (Exact Date)   SpO2 97%   Breastfeeding No   BMI 22.31 kg/m   Visual Acuity Right Eye Distance:   Left Eye Distance:   Bilateral Distance:    Right Eye Near:   Left Eye Near:    Bilateral Near:     Physical Exam Constitutional:      General: She is not in acute distress.    Appearance: Normal appearance. She is not toxic-appearing or diaphoretic.  HENT:     Head: Normocephalic and atraumatic.  Eyes:     Extraocular Movements: Extraocular movements intact.     Conjunctiva/sclera: Conjunctivae normal.  Pulmonary:     Effort: Pulmonary effort is normal.  Feet:     Comments: Patient has mild swelling and bruising discoloration throughout the left great toe.  Limited range of motion due to pain.  Patient does have a healing abrasion present to the medial portion of the proximal left great toe but patient states this is from a previous injury.  Capillary refill and pulses intact. Neurological:     General: No focal deficit present.     Mental Status: She is alert and oriented to person, place, and time. Mental status is at baseline.  Psychiatric:        Mood and Affect: Mood normal.        Behavior: Behavior normal.        Thought Content: Thought content normal.        Judgment: Judgment normal.      UC Treatments / Results  Labs (all labs ordered are listed, but only abnormal results are displayed) Labs Reviewed - No data to display  EKG   Radiology DG Foot Complete Left  Result Date: 07/03/2023 CLINICAL DATA:  Great toe pain. Injury last night. Foot slipped walking downstairs and fell down last 2 steps. EXAM: LEFT FOOT - COMPLETE 3+ VIEW COMPARISON:  None Available. FINDINGS: Normal bone mineralization. Joint spaces are preserved. Mild medial great toe interphalangeal joint space narrowing and subchondral  sclerosis. No acute fracture or dislocation. IMPRESSION: 1. No acute fracture or dislocation. 2. Mild  great toe interphalangeal joint osteoarthritis. Electronically Signed   By: Neita Garnet M.D.   On: 07/03/2023 11:07    Procedures Procedures (including critical care time)  Medications Ordered in UC Medications - No data to display  Initial Impression / Assessment and Plan / UC Course  I have reviewed the triage vital signs and the nursing notes.  Pertinent labs & imaging results that were available during my care of the patient were reviewed by me and considered in my medical decision making (see chart for details).     X-ray of foot was negative for any acute bony abnormality.  Suspect contusion versus muscular strain/injury given fall.  Postop shoe applied prior to discharge to help with support and stability.  Advised supportive care including elevation and ice application.  Advised following up with provided contact for podiatry if symptoms persist or worsen.  Advised safe over-the-counter pain relievers.  Patient verbalized understanding and was agreeable with plan.  Called patient and discussed x-ray results. Final Clinical Impressions(s) / UC Diagnoses   Final diagnoses:  None     Discharge Instructions      I will call with x-ray results.  Postop shoe applied.  Elevate and apply ice.  I have provided you with contact information for foot doctor as well.    ED Prescriptions   None    PDMP not reviewed this encounter.   Gustavus Bryant, [place] 07/03/23 1149

## 2024-01-24 ENCOUNTER — Ambulatory Visit (INDEPENDENT_AMBULATORY_CARE_PROVIDER_SITE_OTHER)

## 2024-01-24 ENCOUNTER — Ambulatory Visit
Admission: EM | Admit: 2024-01-24 | Discharge: 2024-01-24 | Disposition: A | Discharge status: Home / Self Care | Attending: Internal Medicine | Admitting: Internal Medicine

## 2024-01-24 DIAGNOSIS — S92151A Displaced avulsion fracture (chip fracture) of right talus, initial encounter for closed fracture: Secondary | ICD-10-CM

## 2024-01-24 DIAGNOSIS — M25571 Pain in right ankle and joints of right foot: Secondary | ICD-10-CM | POA: Diagnosis not present

## 2024-01-24 DIAGNOSIS — T148XXA Other injury of unspecified body region, initial encounter: Secondary | ICD-10-CM | POA: Diagnosis not present

## 2024-01-24 MED ORDER — NAPROXEN 500 MG PO TABS: 500.0000 mg | ORAL_TABLET | Freq: Two times a day (BID) | ORAL | 0 refills | Status: AC

## 2024-01-24 NOTE — ED Triage Notes (Signed)
 Pt states she fell in a hole in her yard yesterday while chasing her chickens. Now having swelling and bruising to right ankle and foot.

## 2024-01-24 NOTE — ED Provider Notes (Signed)
 [facility]    CSN: [id number] Arrival date & time: 01/24/24  1041      History   Chief Complaint Chief Complaint  Patient presents with   Foot Pain    HPI Barbara Tyler is a 48 y.o. female.    Analyce L. Tyler is a 48 year old female who presents with right foot and ankle pain after misstepping in her yard while chasing a chicken yesterday. She describes the pain as aching, throbbing, and tingling. The pain is worsened by movement, weight-bearing, and palpation. She denies numbness, weakness, loss of sensation, loss of motion, or inability to bear weight. No other injuries are reported.  The following portions of the patient's history were reviewed and updated as appropriate: allergies, current medications, past family history, past medical history, past social history, past surgical history, and problem list.         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   Relationship problems 05/11/2016   Short cervix, antepartum 04/22/2014   Pain, dental 01/06/2014   Subclinical hypothyroidism 04/01/2013   Heterozygous factor V Leiden mutation (HCC) 04/01/2013   History of recurrent spontaneous abortion, not currently pregnant 03/23/2013   Depression 03/23/2013    Past Surgical History:  Procedure Laterality Date   CERVICAL CERCLAGE     CERVICAL CERCLAGE N/A 04/23/2014   Procedure: CERCLAGE CERVICAL;  Surgeon: Granville Layer, MD;  Location: [facility];  Service: Gynecology;  Laterality: N/A;   CRYOTHERAPY     DILATION AND EVACUATION N/A 02/27/2013   Procedure: DILATATION AND EVACUATION;  Surgeon: Granville Layer, MD;  Location: [facility];  Service: Gynecology;  Laterality: N/A;   NO PAST SURGERIES      OB History     Gravida  7   Para  3   Term  2   Preterm  1   AB  4   Living  3      SAB  4   IAB      Ectopic      Multiple  0   Live Births  3            Home Medications    Prior to Admission medications   Medication Sig Start Date End Date Taking? Authorizing Provider  levothyroxine  (SYNTHROID , LEVOTHROID) 75 MCG tablet Take 1 tablet (75 mcg total) by mouth daily before breakfast. 05/26/18  Yes Thomas, Lashunda, NP  naproxen (NAPROSYN) 500 MG tablet Take 1 tablet (500 mg total) by mouth 2 (two) times daily with a meal. 01/24/24  Yes Maryruth Sol, FNP  sertraline  (ZOLOFT ) 100 MG tablet Take 200 mg by mouth daily.   Yes [provider]  aspirin EC 81 MG tablet Take 81 mg by mouth daily. 06/19/22   [provider]  Cholecalciferol (VITAMIN D3) 50 MCG (2000 UT) capsule Take 2,000 Units by mouth daily. 06/19/22   [provider]    Family  History Family History  Problem Relation Age of Onset   Cancer Father    Cancer Maternal Grandmother        breast- great grandma    Social History Social History   Tobacco Use   Smoking status: Never   Smokeless tobacco: Never  Vaping Use   Vaping status: Never Used  Substance Use Topics   Alcohol use: Yes    Alcohol/week: 4.0 standard drinks of alcohol    Types: 4 Cans of beer per week    Comment: rare   Drug use: No     Allergies   Latex   Review of Systems Review of Systems  Musculoskeletal:  Positive for arthralgias, gait problem and joint swelling.  Neurological:  Negative for weakness and numbness.  All other systems reviewed and are negative.    Physical Exam Triage Vital Signs ED Triage Vitals  Encounter Vitals  Group     BP 01/24/24 1103 120/74     Systolic BP Percentile --      Diastolic BP Percentile --      Pulse Rate 01/24/24 1103 67     Resp 01/24/24 1103 16     Temp 01/24/24 1103 97.9 F (36.6 C)     Temp Source 01/24/24 1103 Oral     SpO2 01/24/24 1103 97 %     Weight --      Height --      Head Circumference --      Peak Flow --      Pain Score 01/24/24 1104 4     Pain Loc --      Pain Education --      Exclude from Growth Chart --    No data found.  Updated Vital Signs BP 120/74 (BP Location: Left Arm)   Pulse 67   Temp 97.9 F (36.6 C) (Oral)   Resp 16   LMP 01/21/2024 (Exact Date)   SpO2 97%   Visual Acuity Right Eye Distance:   Left Eye Distance:   Bilateral Distance:    Right Eye Near:   Left Eye Near:    Bilateral Near:     Physical Exam Vitals reviewed.  Constitutional:      General: She is not in acute distress.    Appearance: Normal appearance. She is not toxic-appearing.  HENT:     Head: Normocephalic.     Mouth/Throat:     Mouth: Mucous membranes are moist.  Eyes:     Conjunctiva/sclera: Conjunctivae normal.  Cardiovascular:     Rate and Rhythm: Normal rate and regular rhythm.     Heart sounds: Normal heart sounds.  Pulmonary:     Effort: Pulmonary effort is normal.     Breath sounds: Normal breath sounds.  Musculoskeletal:     Right ankle: Swelling present. No deformity, ecchymosis or lacerations. Tenderness present. Decreased range of motion (due to pain).     Right Achilles Tendon: Normal.     Right foot: Normal range of motion and normal capillary refill. Swelling and tenderness present. Normal pulse.  Skin:    General: Skin is warm and dry.  Neurological:     General: No focal deficit present.     Mental Status: She is alert and oriented to person, place, and time.      UC Treatments / Results  Labs (all labs ordered are listed, but only abnormal results are displayed) Labs Reviewed - No data to display  EKG   Radiology DG  Ankle Complete  Right Result Date: 01/24/2024 CLINICAL DATA:  Decreased range of motion.  Pain and swelling. EXAM: RIGHT ANKLE - COMPLETE 3+ VIEW; RIGHT FOOT COMPLETE - 3+ VIEW COMPARISON:  None Available. FINDINGS: Right ankle: The ankle mortise is symmetric and intact. Moderate lateral and anterior ankle soft tissue swelling. There is mild longitudinal lucency separating a 6 x 2 mm fleck of cortex at the distal dorsal aspect of the talar neck with a similar 4 x less than 1 mm cortical fragment at the adjacent posterior dorsal aspect of the navicular, suspicious for an acute avulsion injury at the origin and insertion of the talonavicular ligament. No significant displacement. Right foot: Normal alignment. Joint spaces are preserved. No additional acute fracture. No dislocation. IMPRESSION: 1. Acute avulsion injury at the origin and insertion of the talonavicular ligament at the dorsal aspect of the talar neck and navicular. No significant displacement. 2. Moderate lateral and anterior ankle soft tissue swelling. Electronically Signed   By: Bertina Broccoli M.D.   On: 01/24/2024 12:13   DG Foot Complete Right Result Date: 01/24/2024 CLINICAL DATA:  Decreased range of motion.  Pain and swelling. EXAM: RIGHT ANKLE - COMPLETE 3+ VIEW; RIGHT FOOT COMPLETE - 3+ VIEW COMPARISON:  None Available. FINDINGS: Right ankle: The ankle mortise is symmetric and intact. Moderate lateral and anterior ankle soft tissue swelling. There is mild longitudinal lucency separating a 6 x 2 mm fleck of cortex at the distal dorsal aspect of the talar neck with a similar 4 x less than 1 mm cortical fragment at the adjacent posterior dorsal aspect of the navicular, suspicious for an acute avulsion injury at the origin and insertion of the talonavicular ligament. No significant displacement. Right foot: Normal alignment. Joint spaces are preserved. No additional acute fracture. No dislocation. IMPRESSION: 1. Acute avulsion injury at the origin and  insertion of the talonavicular ligament at the dorsal aspect of the talar neck and navicular. No significant displacement. 2. Moderate lateral and anterior ankle soft tissue swelling. Electronically Signed   By: Bertina Broccoli M.D.   On: 01/24/2024 12:13    Procedures Procedures (including critical care time)  Medications Ordered in UC Medications - No data to display  Initial Impression / Assessment and Plan / UC Course  I have reviewed the triage vital signs and the nursing notes.  Pertinent labs & imaging results that were available during my care of the patient were reviewed by me and considered in my medical decision making (see chart for details).    48 year old female presenting with acute right foot and ankle pain following an injury sustained yesterday. Physical exam as noted above. X-ray reveals an acute avulsion injury of the talonavicular ligament without significant displacement. The patient was placed in a CAM boot for stabilization. Naproxen was prescribed for pain and inflammation. She was advised to avoid aspirin and other over-the-counter NSAIDs while taking naproxen, but may use Tylenol  concurrently if additional pain relief is needed. Supportive care instructions were provided, including elevation of the affected limb and frequent application of ice. She was instructed to follow up with orthopedics on Monday. Indications for immediate follow-up, including worsening pain, swelling, numbness, or discoloration of the foot or toes, were reviewed and discussed with the patient.  Today's evaluation has revealed no signs of a dangerous process. Discussed diagnosis with patient and/or guardian. Patient and/or guardian aware of their diagnosis, possible red flag symptoms to watch out for and need for close follow up. Patient and/or guardian understands verbal and written discharge  instructions. Patient and/or guardian comfortable with plan and disposition.  Patient and/or guardian has a  clear mental status at this time, good insight into illness (after discussion and teaching) and has clear judgment to make decisions regarding their care  Documentation was completed with the aid of voice recognition software. Transcription may contain typographical errors. Final Clinical Impressions(s) / UC Diagnoses   Final diagnoses:  Pain in joint involving right ankle and foot  Avulsion fracture     Discharge Instructions      You have been diagnosed with a right avulsion injury of the talonavicular ligament. This means that a small piece of bone where the ligament attaches has been pulled away due to stress or force, but there is no significant displacement, which is a good sign. To help your injury heal, you have been placed in a CAM boot to provide support and limit movement. It is important to rest the foot, keep it elevated when possible, and apply ice to the area for 15-20 minutes several times a day to reduce swelling and pain. You have been prescribed naproxen to help with inflammation and pain. Do not take any other over-the-counter anti-inflammatory medications or aspirin while using naproxen, as this can increase the risk of side effects. Watch for signs that require immediate medical attention, including worsening pain or swelling, inability to move your toes, numbness, tingling, or color changes in your foot or toes. Please follow up with orthopedics on Monday for further evaluation and to make sure the injury is healing properly.      ED Prescriptions     Medication Sig Dispense Auth. Provider   naproxen (NAPROSYN) 500 MG tablet Take 1 tablet (500 mg total) by mouth 2 (two) times daily with a meal. 20 tablet Maryruth Sol, FNP      PDMP not reviewed this encounter.   Maryruth Sol, [place] 01/24/24 1316

## 2024-01-24 NOTE — Discharge Instructions (Addendum)
 You have been diagnosed with a right avulsion injury of the talonavicular ligament. This means that a small piece of bone where the ligament attaches has been pulled away due to stress or force, but there is no significant displacement, which is a good sign. To help your injury heal, you have been placed in a CAM boot to provide support and limit movement. It is important to rest the foot, keep it elevated when possible, and apply ice to the area for 15-20 minutes several times a day to reduce swelling and pain. You have been prescribed naproxen to help with inflammation and pain. Do not take any other over-the-counter anti-inflammatory medications or aspirin while using naproxen, as this can increase the risk of side effects. Watch for signs that require immediate medical attention, including worsening pain or swelling, inability to move your toes, numbness, tingling, or color changes in your foot or toes. Please follow up with orthopedics on Monday for further evaluation and to make sure the injury is healing properly.
